# Patient Record
Sex: Female | Born: 1935 | Race: White | Hispanic: No | Marital: Married | Smoking: Former smoker
Health system: Southern US, Community
[De-identification: ages and names within clinical notes are randomized; demographics above are authoritative.]

## PROBLEM LIST (undated history)

## (undated) DIAGNOSIS — F419 Anxiety disorder, unspecified: Secondary | ICD-10-CM

## (undated) DIAGNOSIS — K5900 Constipation, unspecified: Secondary | ICD-10-CM

## (undated) DIAGNOSIS — F329 Major depressive disorder, single episode, unspecified: Secondary | ICD-10-CM

## (undated) DIAGNOSIS — M549 Dorsalgia, unspecified: Secondary | ICD-10-CM

## (undated) DIAGNOSIS — D649 Anemia, unspecified: Secondary | ICD-10-CM

## (undated) DIAGNOSIS — F32A Depression, unspecified: Secondary | ICD-10-CM

## (undated) DIAGNOSIS — J45909 Unspecified asthma, uncomplicated: Secondary | ICD-10-CM

## (undated) DIAGNOSIS — G8929 Other chronic pain: Secondary | ICD-10-CM

## (undated) DIAGNOSIS — G4700 Insomnia, unspecified: Secondary | ICD-10-CM

## (undated) DIAGNOSIS — R112 Nausea with vomiting, unspecified: Secondary | ICD-10-CM

## (undated) DIAGNOSIS — M542 Cervicalgia: Secondary | ICD-10-CM

## (undated) DIAGNOSIS — K219 Gastro-esophageal reflux disease without esophagitis: Secondary | ICD-10-CM

## (undated) DIAGNOSIS — M1990 Unspecified osteoarthritis, unspecified site: Secondary | ICD-10-CM

## (undated) DIAGNOSIS — Z9889 Other specified postprocedural states: Secondary | ICD-10-CM

## (undated) HISTORY — PX: CERVICAL SPINE SURGERY: SHX589

## (undated) HISTORY — PX: COLONOSCOPY W/ POLYPECTOMY: SHX1380

## (undated) HISTORY — PX: ROTATOR CUFF REPAIR: SHX139

## (undated) HISTORY — PX: CERVICAL FUSION: SHX112

## (undated) HISTORY — PX: SHOULDER ARTHROSCOPY: SHX128

## (undated) HISTORY — PX: CARPAL TUNNEL RELEASE: SHX101

## (undated) HISTORY — PX: BACK SURGERY: SHX140

---

## 1999-12-20 ENCOUNTER — Encounter: Payer: Self-pay | Admitting: Urology

## 1999-12-20 ENCOUNTER — Encounter: Admission: RE | Admit: 1999-12-20 | Discharge: 1999-12-20 | Payer: Self-pay | Admitting: Urology

## 1999-12-26 ENCOUNTER — Encounter: Admission: RE | Admit: 1999-12-26 | Discharge: 1999-12-26 | Payer: Self-pay | Admitting: Gastroenterology

## 1999-12-26 ENCOUNTER — Encounter: Payer: Self-pay | Admitting: Gastroenterology

## 2005-03-06 ENCOUNTER — Inpatient Hospital Stay (HOSPITAL_COMMUNITY): Admission: RE | Admit: 2005-03-06 | Discharge: 2005-03-07 | Payer: Self-pay | Admitting: Neurosurgery

## 2005-06-07 ENCOUNTER — Inpatient Hospital Stay (HOSPITAL_COMMUNITY): Admission: RE | Admit: 2005-06-07 | Discharge: 2005-06-13 | Payer: Self-pay | Admitting: Neurosurgery

## 2012-03-24 ENCOUNTER — Other Ambulatory Visit: Payer: Self-pay | Admitting: Orthopedic Surgery

## 2012-03-27 ENCOUNTER — Encounter (HOSPITAL_BASED_OUTPATIENT_CLINIC_OR_DEPARTMENT_OTHER): Payer: Self-pay | Admitting: *Deleted

## 2012-03-27 NOTE — Progress Notes (Signed)
Wife from [place]-very anxious-chronic pain-talked with husband No labs needed

## 2012-04-01 ENCOUNTER — Encounter (HOSPITAL_BASED_OUTPATIENT_CLINIC_OR_DEPARTMENT_OTHER): Payer: Self-pay | Admitting: Certified Registered Nurse Anesthetist

## 2012-04-01 ENCOUNTER — Ambulatory Visit (HOSPITAL_BASED_OUTPATIENT_CLINIC_OR_DEPARTMENT_OTHER): Payer: Medicare Other | Admitting: Certified Registered Nurse Anesthetist

## 2012-04-01 ENCOUNTER — Encounter (HOSPITAL_BASED_OUTPATIENT_CLINIC_OR_DEPARTMENT_OTHER): Payer: Self-pay | Admitting: Orthopedic Surgery

## 2012-04-01 ENCOUNTER — Ambulatory Visit (HOSPITAL_BASED_OUTPATIENT_CLINIC_OR_DEPARTMENT_OTHER)
Admission: RE | Admit: 2012-04-01 | Discharge: 2012-04-01 | Disposition: A | Discharge status: Home / Self Care | Payer: Medicare Other | Source: Ambulatory Visit | Attending: Orthopedic Surgery | Admitting: Orthopedic Surgery

## 2012-04-01 ENCOUNTER — Encounter (HOSPITAL_BASED_OUTPATIENT_CLINIC_OR_DEPARTMENT_OTHER)
Admission: RE | Disposition: A | Discharge status: Home / Self Care | Payer: Self-pay | Source: Ambulatory Visit | Attending: Orthopedic Surgery

## 2012-04-01 ENCOUNTER — Encounter (HOSPITAL_BASED_OUTPATIENT_CLINIC_OR_DEPARTMENT_OTHER): Payer: Self-pay | Admitting: *Deleted

## 2012-04-01 DIAGNOSIS — M65839 Other synovitis and tenosynovitis, unspecified forearm: Secondary | ICD-10-CM | POA: Insufficient documentation

## 2012-04-01 DIAGNOSIS — M6530 Trigger finger, unspecified finger: Secondary | ICD-10-CM | POA: Insufficient documentation

## 2012-04-01 DIAGNOSIS — K219 Gastro-esophageal reflux disease without esophagitis: Secondary | ICD-10-CM | POA: Insufficient documentation

## 2012-04-01 DIAGNOSIS — M65849 Other synovitis and tenosynovitis, unspecified hand: Secondary | ICD-10-CM | POA: Insufficient documentation

## 2012-04-01 HISTORY — DX: Constipation, unspecified: K59.00

## 2012-04-01 HISTORY — DX: Anxiety disorder, unspecified: F41.9

## 2012-04-01 HISTORY — DX: Cervicalgia: M54.2

## 2012-04-01 HISTORY — PX: TRIGGER FINGER RELEASE: SHX641

## 2012-04-01 HISTORY — DX: Insomnia, unspecified: G47.00

## 2012-04-01 HISTORY — DX: Other chronic pain: G89.29

## 2012-04-01 HISTORY — DX: Gastro-esophageal reflux disease without esophagitis: K21.9

## 2012-04-01 HISTORY — DX: Unspecified osteoarthritis, unspecified site: M19.90

## 2012-04-01 HISTORY — DX: Major depressive disorder, single episode, unspecified: F32.9

## 2012-04-01 HISTORY — DX: Dorsalgia, unspecified: M54.9

## 2012-04-01 HISTORY — DX: Depression, unspecified: F32.A

## 2012-04-01 LAB — POCT HEMOGLOBIN-HEMACUE: Hemoglobin: 10.8 g/dL — ABNORMAL LOW (ref 12.0–15.0)

## 2012-04-01 SURGERY — MINOR RELEASE TRIGGER FINGER/A-1 PULLEY
Anesthesia: Monitor Anesthesia Care | Site: Hand | Laterality: Right | Wound class: Clean

## 2012-04-01 MED ORDER — LACTATED RINGERS IV SOLN
INTRAVENOUS | Status: DC
  Administered 2012-04-01: 09:00:00 via INTRAVENOUS

## 2012-04-01 MED ORDER — FENTANYL CITRATE 0.05 MG/ML IJ SOLN
INTRAMUSCULAR | Status: DC | PRN
  Administered 2012-04-01 (×2): 50 ug via INTRAVENOUS

## 2012-04-01 MED ORDER — PROPOFOL 10 MG/ML IV EMUL
INTRAVENOUS | Status: DC | PRN
  Administered 2012-04-01: 75 ug/kg/min via INTRAVENOUS

## 2012-04-01 MED ORDER — CEFAZOLIN SODIUM 1-5 GM-% IV SOLN
INTRAVENOUS | Status: DC | PRN
  Administered 2012-04-01: 1 g via INTRAVENOUS

## 2012-04-01 MED ORDER — CHLORHEXIDINE GLUCONATE 4 % EX LIQD: 60.0000 mL | Freq: Once | CUTANEOUS | Status: DC

## 2012-04-01 MED ORDER — OXYCODONE HCL 5 MG PO TABS: 5.0000 mg | ORAL_TABLET | Freq: Once | ORAL | Status: DC | PRN

## 2012-04-01 MED ORDER — ONDANSETRON HCL 4 MG/2ML IJ SOLN
INTRAMUSCULAR | Status: DC | PRN
  Administered 2012-04-01: 4 mg via INTRAVENOUS

## 2012-04-01 MED ORDER — MIDAZOLAM HCL 5 MG/5ML IJ SOLN
INTRAMUSCULAR | Status: DC | PRN
  Administered 2012-04-01 (×2): 1 mg via INTRAVENOUS

## 2012-04-01 MED ORDER — METOCLOPRAMIDE HCL 5 MG/ML IJ SOLN: 10.0000 mg | Freq: Once | INTRAMUSCULAR | Status: DC | PRN

## 2012-04-01 MED ORDER — LIDOCAINE HCL (PF) 0.5 % IJ SOLN
INTRAMUSCULAR | Status: DC | PRN
  Administered 2012-04-01: 30 mL via INTRATHECAL

## 2012-04-01 MED ORDER — FENTANYL CITRATE 0.05 MG/ML IJ SOLN: 25.0000 ug | INTRAMUSCULAR | Status: DC | PRN

## 2012-04-01 MED ORDER — HYDROCODONE-ACETAMINOPHEN 5-500 MG PO TABS: 1.0000 | ORAL_TABLET | ORAL | Status: AC | PRN

## 2012-04-01 MED ORDER — BUPIVACAINE HCL (PF) 0.25 % IJ SOLN
INTRAMUSCULAR | Status: DC | PRN
  Administered 2012-04-01: 8 mL

## 2012-04-01 MED ORDER — DEXAMETHASONE SODIUM PHOSPHATE 10 MG/ML IJ SOLN
INTRAMUSCULAR | Status: DC | PRN
  Administered 2012-04-01: 10 mg via INTRAVENOUS

## 2012-04-01 MED ORDER — CEFAZOLIN SODIUM 1-5 GM-% IV SOLN: 1.0000 g | INTRAVENOUS | Status: DC

## 2012-04-01 SURGICAL SUPPLY — 32 items
BANDAGE COBAN STERILE 2 (GAUZE/BANDAGES/DRESSINGS) ×3 IMPLANT
BLADE SURG 15 STRL LF DISP TIS (BLADE) ×2 IMPLANT
BLADE SURG 15 STRL SS (BLADE) ×3
BNDG CMPR 9X4 STRL LF SNTH (GAUZE/BANDAGES/DRESSINGS)
BNDG ESMARK 4X9 LF (GAUZE/BANDAGES/DRESSINGS) IMPLANT
CHLORAPREP W/TINT 26ML (MISCELLANEOUS) ×3 IMPLANT
CLOTH BEACON ORANGE TIMEOUT ST (SAFETY) ×3 IMPLANT
CORDS BIPOLAR (ELECTRODE) ×2 IMPLANT
COVER MAYO STAND STRL (DRAPES) ×3 IMPLANT
COVER TABLE BACK 60X90 (DRAPES) ×3 IMPLANT
CUFF TOURNIQUET SINGLE 18IN (TOURNIQUET CUFF) ×2 IMPLANT
DECANTER SPIKE VIAL GLASS SM (MISCELLANEOUS) IMPLANT
DRAPE EXTREMITY T 121X128X90 (DRAPE) ×3 IMPLANT
DRAPE SURG 17X23 STRL (DRAPES) ×3 IMPLANT
GAUZE XEROFORM 1X8 LF (GAUZE/BANDAGES/DRESSINGS) ×3 IMPLANT
GLOVE BIO SURGEON STRL SZ 6.5 (GLOVE) ×3 IMPLANT
GLOVE SURG ORTHO 8.0 STRL STRW (GLOVE) ×3 IMPLANT
GOWN BRE IMP PREV XXLGXLNG (GOWN DISPOSABLE) ×3 IMPLANT
GOWN PREVENTION PLUS XLARGE (GOWN DISPOSABLE) ×3 IMPLANT
NEEDLE 27GAX1X1/2 (NEEDLE) IMPLANT
NS IRRIG 1000ML POUR BTL (IV SOLUTION) ×3 IMPLANT
PACK BASIN DAY SURGERY FS (CUSTOM PROCEDURE TRAY) ×3 IMPLANT
PADDING CAST ABS 4INX4YD NS (CAST SUPPLIES) ×1
PADDING CAST ABS COTTON 4X4 ST (CAST SUPPLIES) ×2 IMPLANT
SPONGE GAUZE 4X4 12PLY (GAUZE/BANDAGES/DRESSINGS) ×3 IMPLANT
STOCKINETTE 4X48 STRL (DRAPES) ×3 IMPLANT
SUT VICRYL RAPIDE 4/0 PS 2 (SUTURE) ×3 IMPLANT
SYR BULB 3OZ (MISCELLANEOUS) ×3 IMPLANT
SYR CONTROL 10ML LL (SYRINGE) IMPLANT
TOWEL OR 17X24 6PK STRL BLUE (TOWEL DISPOSABLE) ×6 IMPLANT
UNDERPAD 30X30 INCONTINENT (UNDERPADS AND DIAPERS) ×3 IMPLANT
WATER STERILE IRR 1000ML POUR (IV SOLUTION) ×3 IMPLANT

## 2012-04-01 NOTE — Anesthesia Preprocedure Evaluation (Signed)
Anesthesia Evaluation  Patient identified by MRN, date of birth, ID band Patient awake    Reviewed: Allergy & Precautions, H&P , NPO status , Patient's Chart, lab work & pertinent test results, reviewed documented beta blocker date and time   Airway Mallampati: II TM Distance: >3 FB Neck ROM: full    Dental   Pulmonary neg pulmonary ROS,          Cardiovascular negative cardio ROS      Neuro/Psych negative neurological ROS  negative psych ROS   GI/Hepatic Neg liver ROS, GERD-  Medicated and Controlled,  Endo/Other  negative endocrine ROS  Renal/GU negative Renal ROS  negative genitourinary   Musculoskeletal   Abdominal   Peds  Hematology negative hematology ROS (+)   Anesthesia Other Findings See surgeon's H&P   Reproductive/Obstetrics negative OB ROS                           Anesthesia Physical Anesthesia Plan  ASA: II  Anesthesia Plan: MAC and Bier Block   Post-op Pain Management:    Induction:   Airway Management Planned: Simple Face Mask  Additional Equipment:   Intra-op Plan:   Post-operative Plan:   Informed Consent: I have reviewed the patients History and Physical, chart, labs and discussed the procedure including the risks, benefits and alternatives for the proposed anesthesia with the patient or authorized representative who has indicated his/her understanding and acceptance.   Dental Advisory Given  Plan Discussed with: CRNA and Surgeon  Anesthesia Plan Comments:         Anesthesia Quick Evaluation

## 2012-04-01 NOTE — Op Note (Signed)
NAME:  Wendy Le,                        ACCOUNT NO.:  [id number]  MEDICAL RECORD NO.:  [id number]  LOCATION:                                 FACILITY:  PHYSICIAN:  Cindee Salt, M.D.            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  POSTOPERATIVE DIAGNOSES:  Stenosing tenosynovitis of right middle and right ring fingers.  Triggering middle finger distally.  OPERATION:  Release A1 pulley of right middle and right ring finger with excision of one limb superficialis ulnar side of middle finger.  SURGEON:  Cindee Salt, M.D.  ANESTHESIA:  Forearm based IV regional with local infiltration.  ANESTHESIOLOGIST:  Janetta Hora. Gelene Mink, M.D.  HISTORY:  The patient is a 76 year old female with a history of triggering of her right middle and right ring finger.  This is not responded to conservative treatment.  She has elected to undergo surgical release of the triggering. Pre, peri, and postoperative course had been discussed along with risks and complications.  She is aware that there is no guarantee with the surgery; possibility of infection; recurrence of injury to arteries, nerves, tendons; incomplete relief of symptoms and dystrophy.  In the preoperative area, the patient is seen, the extremity marked by both the patient and surgeon, and antibiotic given.  PROCEDURE:  The patient was brought to the operating room where a forearm-based IV regional anesthetic was carried out without difficulty. She was prepped using ChloraPrep, supine position with left arm free.  A 3-minute dry time was allowed.  Time-out taken, confirming the patient and procedure.  An oblique incision was made over the A1 pulley of the right middle finger carried down through subcutaneous tissue.  Bleeders were electrocauterized with bipolar.  The A1 pulley was  released on its radial aspect.  Significant fraying of the superficialis tendon both slips was immediately noted.  A small incision was made centrally in A2. A cyst was present on the flexor sheath, this was excised on the A1 pulley.  The finger placed through a full range motion, no further triggering was noted with flexion and extension.  The wound was extended distally.  A large cyst was present over the central aspect of the A2 pulley.  The tendons were brought proximally.  A significant enlargement of the ulnar slip of the superficialis was noted.  It was decided to proceed with excision of this with to prevent continued triggering.  The slip was brought proximally.  This was transected distally.  An oblique incision was made proximally maintaining the radial slip intact.  Care was taken to protect the neurovascular bundles both radially and ulnarly.  The finger placed through a full range motion, no further triggering was noted.  The wound was copiously irrigated with saline and the skin closed with interrupted 4-0 Vicryl Rapide sutures.  A separate incision was then made obliquely over the ring finger A1 pulley, again carried down through subcutaneous tissue, thickening of the pulley was immediately  encountered, this was released on its radial aspect.  A significant fluid collection was noted proximally.  The small incision was made centrally in A2 after releasing the radial side of the A1.  The finger placed through full range motion, no further triggering was noted.  The wound was copiously irrigated with saline.  The skin was closed with interrupted 4-0 Vicryl Rapide sutures.  Local infiltration with 0.25% Marcaine without epinephrine was given, approximately 7-8 mL was used.  A sterile compressive dressing to the palm and fingers was applied.  On deflation of the tourniquet, all fingers were immediately pinked.  She was taken to the recovery room.           ______________________________ Cindee Salt, M.D.     GK/MEDQ  D:  04/01/2012  T:  04/01/2012  Job:  [id number]

## 2012-04-01 NOTE — Anesthesia Postprocedure Evaluation (Signed)
Anesthesia Post Note  Patient: Wendy Le  Procedure(s) Performed: Procedure(s) (LRB): RELEASE TRIGGER FINGER/A-1 PULLEY (Right)  Anesthesia type: MAC  Patient location: PACU  Post pain: Pain level controlled  Post assessment: Patient's Cardiovascular Status Stable  Last Vitals:  Filed Vitals:   04/01/12 1126  BP: 130/49  Pulse: 77  Temp: 36.7 C  Resp: 16    Post vital signs: Reviewed and stable  Level of consciousness: alert  Complications: No apparent anesthesia complications

## 2012-04-01 NOTE — Op Note (Signed)
Dictated number: [id number]

## 2012-04-01 NOTE — Anesthesia Procedure Notes (Signed)
Procedure Name: MAC Date/Time: 04/01/2012 10:01 AM Performed by: Burna Cash Pre-anesthesia Checklist: Patient identified, Emergency Drugs available, Suction available, Patient being monitored and Timeout performed Oxygen Delivery Method: Simple face mask

## 2012-04-01 NOTE — Brief Op Note (Signed)
04/01/2012  10:42 AM  PATIENT:  Wendy Le  76 y.o. female  PRE-OPERATIVE DIAGNOSIS:  stenosing tenosynovitis right middle and ring fingers  POST-OPERATIVE DIAGNOSIS:  same as preop  PROCEDURE:  Procedure(s) (LRB): RELEASE TRIGGER FINGER/A-1 PULLEY (Right)  SURGEON:  Surgeon(s) and Role:    * Nicki Reaper, MD - Primary  PHYSICIAN ASSISTANT:   ASSISTANTS: none   ANESTHESIA:   local and regional  EBL:  Total I/O In: 700 [I.V.:700] Out: -   BLOOD ADMINISTERED:none  DRAINS: none   LOCAL MEDICATIONS USED:  MARCAINE     SPECIMEN:  No Specimen  DISPOSITION OF SPECIMEN:  N/A  COUNTS:  YES  TOURNIQUET:   Total Tourniquet Time Documented: Forearm (Right) - 27 minutes  DICTATION: .Other Dictation: Dictation Number [id number]  PLAN OF CARE: Discharge to home after PACU  PATIENT DISPOSITION:  PACU - hemodynamically stable.

## 2012-04-01 NOTE — Transfer of Care (Signed)
Immediate Anesthesia Transfer of Care Note  Patient: Wendy Le  Procedure(s) Performed: Procedure(s) (LRB): RELEASE TRIGGER FINGER/A-1 PULLEY (Right)  Patient Location: PACU  Anesthesia Type: Bier block  Level of Consciousness: awake, alert  and oriented  Airway & Oxygen Therapy: Patient Spontanous Breathing  Post-op Assessment: Report given to PACU RN and Post -op Vital signs reviewed and stable  Post vital signs: Reviewed and stable  Complications: No apparent anesthesia complications

## 2012-04-01 NOTE — Discharge Instructions (Addendum)

## 2012-04-01 NOTE — H&P (Signed)
Wendy Le is a 76 year-old right-hand dominant female referred by Dr. Hilda Lias for consultation with complaint of bilateral hand pain, contractures every day.  This has been going on for several years.  She has very long history having had bilateral carpal tunnel release done 7-8 years ago by Dr. Eden Emms, subsequently had cervical fusion x 2 by Dr. Jeral Fruit at C5-6.  She has atrophy of her thenar musculature.  She is complaining of tremor, discomfort, difficulty using her hands.  She has been taking meloxicam . She has no history of diabetes, thyroid problems, arthritis or gout.  She has had nerve conductions repeated revealing bilateral changes in the median nerves with an ulnar neuropathy at her elbow on her left side.    ALLERGIES:   Blue dye. MEDICATIONS:    Meloxicam, citalopram, pantoprazole, hydrodocone/apap, tramadol, zolpidem, cyclobenzaprine, alprazolam, Amitiza. SURGICAL HISTORY:  Surgeries noted in history.     FAMILY MEDICAL HISTORY:   Negative. SOCIAL HISTORY:    She does not smoke or drink. She is married, retired.  REVIEW OF SYSTEMS:   Positive for glasses, headaches, otherwise negative 14 points.  Wendy Le is an 76 y.o. female.   Chief Complaint: sts rmf / rrf HPI: see above  Past Medical History  Diagnosis Date  . Anxiety   . Depression   . Arthritis   . Chronic back pain   . Chronic neck pain   . GERD (gastroesophageal reflux disease)   . Constipation   . Insomnia     Past Surgical History  Procedure Date  . Shoulder arthroscopy     right  . Cervical fusion   . Back surgery     lumb   . Carpal tunnel release     bilat    No family history on file. Social History:  reports that she quit smoking about 23 years ago. She does not have any smokeless tobacco history on file. She reports that she drinks alcohol. She reports that she does not use illicit drugs.  Allergies:  Allergies  Allergen Reactions  . Contrast Media (Iodinated Diagnostic Agents)  Anaphylaxis    Blue dye    Medications Prior to Admission  Medication Sig Dispense Refill  . ALPRAZolam (XANAX) 0.5 MG tablet Take 0.5 mg by mouth at bedtime as needed.      . citalopram (CELEXA) 40 MG tablet Take 40 mg by mouth daily.      . cyclobenzaprine (FLEXERIL) 10 MG tablet Take 10 mg by mouth 3 (three) times daily as needed.      Marland Kitchen HYDROcodone-acetaminophen (NORCO) 5-325 MG per tablet Take 1 tablet by mouth every 6 (six) hours as needed.      . meloxicam (MOBIC) 15 MG tablet Take 15 mg by mouth daily.      Marland Kitchen omeprazole (PRILOSEC) 40 MG capsule Take 40 mg by mouth daily.      . SUMAtriptan (IMITREX) 100 MG tablet Take 100 mg by mouth every 2 (two) hours as needed.      . traMADol (ULTRAM) 50 MG tablet Take 50 mg by mouth every 6 (six) hours as needed.      . zolpidem (AMBIEN) 10 MG tablet Take 10 mg by mouth at bedtime as needed.        No results found for this or any previous visit (from the past 48 hour(s)).  No results found.   Pertinent items are noted in HPI.  Blood pressure 135/75, pulse 80, temperature 97.9 F (36.6 C),  temperature source Oral, resp. rate 18, height 5\' 3"  (1.6 m), weight 64.411 kg (142 lb), SpO2 95.00%.  General appearance: alert, cooperative and appears stated age Head: Normocephalic, without obvious abnormality Neck: no adenopathy Resp: clear to auscultation bilaterally Cardio: regular rate and rhythm, S1, S2 normal, no murmur, click, rub or gallop GI: soft, non-tender; bowel sounds normal; no masses,  no organomegaly Extremities: extremities normal, atraumatic, no cyanosis or edema Pulses: 2+ and symmetric Skin: Skin color, texture, turgor normal. No rashes or lesions Neurologic: Grossly normal Incision/Wound: na  Assessment/Plan   Her right side, middle and ring fingers continue to trigger for her.   Sensation and circulation are intact.  Nodules are easily palpable with triggering of both middle and ring, right hand.  We have discussed  with her the possibility of surgical decompression of the A-1 pulleys middle and ring fingers of her right hand.    The pre, peri and postoperative course were discussed along with the risks and complications.  The patient is aware there is no guarantee with the surgery, possibility of infection, recurrence, injury to arteries, nerves, tendons, incomplete relief of symptoms and dystrophy.  She has had two injections with no relief.  She is scheduled for release A-1 pulley middle and ring fingers right hand as an outpatient.  Wendy Le R 04/01/2012, 9:45 AM

## 2012-04-02 ENCOUNTER — Encounter (HOSPITAL_BASED_OUTPATIENT_CLINIC_OR_DEPARTMENT_OTHER): Payer: Self-pay | Admitting: Orthopedic Surgery

## 2014-06-30 ENCOUNTER — Other Ambulatory Visit: Payer: Self-pay | Admitting: Neurosurgery

## 2014-06-30 DIAGNOSIS — M5417 Radiculopathy, lumbosacral region: Secondary | ICD-10-CM

## 2014-07-13 ENCOUNTER — Ambulatory Visit
Admission: RE | Admit: 2014-07-13 | Discharge: 2014-07-13 | Disposition: A | Discharge status: Home / Self Care | Payer: Commercial Managed Care - HMO | Source: Ambulatory Visit | Attending: Neurosurgery | Admitting: Neurosurgery

## 2014-07-13 VITALS — BP 123/64 | HR 64

## 2014-07-13 DIAGNOSIS — M5417 Radiculopathy, lumbosacral region: Secondary | ICD-10-CM

## 2014-07-13 MED ORDER — MEPERIDINE HCL 100 MG/ML IJ SOLN
50.0000 mg | Freq: Once | INTRAMUSCULAR | Status: AC
  Administered 2014-07-13: 50 mg via INTRAMUSCULAR

## 2014-07-13 MED ORDER — IOHEXOL 180 MG/ML  SOLN
20.0000 mL | Freq: Once | INTRAMUSCULAR | Status: AC | PRN
  Administered 2014-07-13: 18 mL via INTRATHECAL

## 2014-07-13 MED ORDER — DIAZEPAM 5 MG PO TABS
5.0000 mg | ORAL_TABLET | Freq: Once | ORAL | Status: AC
  Administered 2014-07-13: 5 mg via ORAL

## 2014-07-13 MED ORDER — ONDANSETRON HCL 4 MG/2ML IJ SOLN
4.0000 mg | Freq: Once | INTRAMUSCULAR | Status: AC
  Administered 2014-07-13: 4 mg via INTRAMUSCULAR

## 2014-07-13 NOTE — Discharge Instructions (Signed)
Myelogram Discharge Instructions  1. Go home and rest quietly for the next 24 hours.  It is important to lie flat for the next 24 hours.  Get up only to go to the restroom.  You may lie in the bed or on a couch on your back, your stomach, your left side or your right side.  You may have one pillow under your head.  You may have pillows between your knees while you are on your side or under your knees while you are on your back.  2. DO NOT drive today.  Recline the seat as far back as it will go, while still wearing your seat belt, on the way home.  3. You may get up to go to the bathroom as needed.  You may sit up for 10 minutes to eat.  You may resume your normal diet and medications unless otherwise indicated.  Drink lots of extra fluids today and tomorrow.  4. The incidence of headache, nausea, or vomiting is about 5% (one in 20 patients).  If you develop a headache, lie flat and drink plenty of fluids until the headache goes away.  Caffeinated beverages may be helpful.  If you develop severe nausea and vomiting or a headache that does not go away with flat bed rest, call [phone].  5. You may resume normal activities after your 24 hours of bed rest is over; however, do not exert yourself strongly or do any heavy lifting tomorrow. If when you get up you have a headache when standing, go back to bed and force fluids for another 24 hours.  6. Call your physician for a follow-up appointment.  The results of your myelogram will be sent directly to your physician by the following day.  7. If you have any questions or if complications develop after you arrive home, please call [phone].  Discharge instructions have been explained to the patient.  The patient, or the person responsible for the patient, fully understands these instructions.      May resume Celexa, Imitrex, Sumatriptan and Tramadol on Sept. 24, 2015, after 1:00 pm.

## 2014-07-13 NOTE — Progress Notes (Signed)
Patient states she has been off Celexa, Sumatriptan and Tramadol for at least the past two days.  Patient states she took her 13-hour prep as prescribed.

## 2014-07-28 ENCOUNTER — Other Ambulatory Visit: Payer: Self-pay | Admitting: Neurosurgery

## 2014-08-15 ENCOUNTER — Encounter (HOSPITAL_COMMUNITY): Payer: Self-pay | Admitting: Pharmacy Technician

## 2014-08-18 ENCOUNTER — Encounter (HOSPITAL_COMMUNITY): Payer: Self-pay

## 2014-08-18 ENCOUNTER — Encounter (HOSPITAL_COMMUNITY)
Admission: RE | Admit: 2014-08-18 | Discharge: 2014-08-18 | Disposition: A | Discharge status: Home / Self Care | Payer: Medicare PPO | Source: Ambulatory Visit | Attending: Neurosurgery | Admitting: Neurosurgery

## 2014-08-18 DIAGNOSIS — Z01818 Encounter for other preprocedural examination: Secondary | ICD-10-CM | POA: Diagnosis present

## 2014-08-18 HISTORY — DX: Nausea with vomiting, unspecified: R11.2

## 2014-08-18 HISTORY — DX: Other specified postprocedural states: Z98.890

## 2014-08-18 HISTORY — DX: Anemia, unspecified: D64.9

## 2014-08-18 HISTORY — DX: Unspecified asthma, uncomplicated: J45.909

## 2014-08-18 LAB — BASIC METABOLIC PANEL
Anion gap: 12 (ref 5–15)
BUN: 17 mg/dL (ref 6–23)
CHLORIDE: 103 meq/L (ref 96–112)
CO2: 26 mEq/L (ref 19–32)
CREATININE: 0.81 mg/dL (ref 0.50–1.10)
Calcium: 9.5 mg/dL (ref 8.4–10.5)
GFR calc Af Amer: 80 mL/min — ABNORMAL LOW (ref >90)
GFR calc non Af Amer: 69 mL/min — ABNORMAL LOW (ref >90)
Glucose, Bld: 85 mg/dL (ref 70–99)
Potassium: 3.9 mEq/L (ref 3.7–5.3)
Sodium: 141 mEq/L (ref 137–147)

## 2014-08-18 LAB — CBC
HEMATOCRIT: 35.7 % — AB (ref 36.0–46.0)
Hemoglobin: 12.2 g/dL (ref 12.0–15.0)
MCH: 31.3 pg (ref 26.0–34.0)
MCHC: 34.2 g/dL (ref 30.0–36.0)
MCV: 91.5 fL (ref 78.0–100.0)
Platelets: 346 10*3/uL (ref 150–400)
RBC: 3.9 MIL/uL (ref 3.87–5.11)
RDW: 13.6 % (ref 11.5–15.5)
WBC: 8.5 10*3/uL (ref 4.0–10.5)

## 2014-08-18 LAB — SURGICAL PCR SCREEN
MRSA, PCR: NEGATIVE
Staphylococcus aureus: NEGATIVE

## 2014-08-25 MED ORDER — CEFAZOLIN SODIUM-DEXTROSE 2-3 GM-% IV SOLR: 2.0000 g | INTRAVENOUS | Status: DC

## 2014-08-25 NOTE — H&P (Addendum)
Wendy Le is an 78 y.o. female.   Chief Complaint: pain in both legs HPI: patient who had lumbar fusion at l5s1 about two years ago but now she is developing lumbar pain with radiation to both lower extremities, no better with conservative treatment.   Past Medical History  Diagnosis Date  . Anxiety   . Depression   . Arthritis   . Chronic back pain   . Chronic neck pain   . GERD (gastroesophageal reflux disease)   . Constipation   . Insomnia   . PONV (postoperative nausea and vomiting)   . Asthma     does not use inhaler  . Anemia     Past Surgical History  Procedure Laterality Date  . Shoulder arthroscopy      right  . Cervical fusion    . Back surgery      lumb   . Carpal tunnel release      bilat  . Trigger finger release  04/01/2012    Procedure: MINOR RELEASE TRIGGER FINGER/A-1 PULLEY;  Surgeon: Nicki ReaperGary R Kuzma, MD;  [facility]  [facility];  Service: Orthopedics;  Laterality: Right;  right middle and ring fingers  . Cervical spine surgery    . Rotator cuff repair Bilateral   . Colonoscopy w/ polypectomy      No family history on file. Social History:  reports that she quit smoking about 25 years ago. She does not have any smokeless tobacco history on file. She reports that she drinks alcohol. She reports that she does not use illicit drugs.  Allergies:  Allergies  Allergen Reactions  . Other Hives and Other (See Comments)    methyline blue was injected into bladder/kidneys via catheter inserted into bladder "many, many years ago."  Also caused blisters.    No prescriptions prior to admission    No results found for this or any previous visit (from the past 48 hour(s)). No results found.  Review of Systems  Constitutional: Negative.   HENT: Negative.   Eyes: Negative.   Respiratory: Negative.   Cardiovascular: Negative.   Gastrointestinal: Negative.   Musculoskeletal: Positive for back pain.  Skin: Negative.   Neurological: Positive for  sensory change and focal weakness.  Endo/Heme/Allergies: Negative.   Psychiatric/Behavioral: Negative.     There were no vitals taken for this visit. Physical Exam hent, nl. Neck, nl. Cv, nl. Lungs, clear. Cv, nl. Abdomen, soft. Extremities, nl. NEURO  slr positive bilaterally. Some proximal weakness. Dtr, nl. Myelogram of the limbar spine shows severe stenosis at lumbar23, 34 with solid fusion at l5s1  Assessment/Plan Patient had conservative treatment including epidural injections with no relief of the pain. She and her husbang agree with decompression and are aware of risks and benefits  Nasiir Monts M 08/25/2014, 5:43 PM

## 2014-08-26 ENCOUNTER — Inpatient Hospital Stay (HOSPITAL_COMMUNITY): Payer: Medicare PPO | Admitting: Anesthesiology

## 2014-08-26 ENCOUNTER — Encounter (HOSPITAL_COMMUNITY)
Admission: RE | Disposition: A | Discharge status: Home / Self Care | Payer: Self-pay | Source: Ambulatory Visit | Attending: Neurosurgery

## 2014-08-26 ENCOUNTER — Encounter (HOSPITAL_COMMUNITY): Payer: Self-pay | Admitting: *Deleted

## 2014-08-26 ENCOUNTER — Inpatient Hospital Stay (HOSPITAL_COMMUNITY)
Admission: RE | Admit: 2014-08-26 | Discharge: 2014-08-29 | DRG: 517 | Disposition: A | Discharge status: Home / Self Care | Payer: Medicare PPO | Source: Ambulatory Visit | Attending: Neurosurgery | Admitting: Neurosurgery

## 2014-08-26 ENCOUNTER — Inpatient Hospital Stay (HOSPITAL_COMMUNITY): Payer: Medicare PPO

## 2014-08-26 DIAGNOSIS — Z87891 Personal history of nicotine dependence: Secondary | ICD-10-CM

## 2014-08-26 DIAGNOSIS — M48061 Spinal stenosis, lumbar region without neurogenic claudication: Secondary | ICD-10-CM

## 2014-08-26 DIAGNOSIS — M545 Low back pain: Secondary | ICD-10-CM | POA: Diagnosis present

## 2014-08-26 DIAGNOSIS — Z23 Encounter for immunization: Secondary | ICD-10-CM

## 2014-08-26 DIAGNOSIS — M4806 Spinal stenosis, lumbar region: Principal | ICD-10-CM | POA: Diagnosis present

## 2014-08-26 DIAGNOSIS — I739 Peripheral vascular disease, unspecified: Secondary | ICD-10-CM | POA: Diagnosis present

## 2014-08-26 DIAGNOSIS — M48062 Spinal stenosis, lumbar region with neurogenic claudication: Secondary | ICD-10-CM | POA: Diagnosis present

## 2014-08-26 HISTORY — PX: LUMBAR LAMINECTOMY/DECOMPRESSION MICRODISCECTOMY: SHX5026

## 2014-08-26 SURGERY — LUMBAR LAMINECTOMY/DECOMPRESSION MICRODISCECTOMY 2 LEVELS
Anesthesia: General

## 2014-08-26 MED ORDER — OXYCODONE-ACETAMINOPHEN 5-325 MG PO TABS
ORAL_TABLET | ORAL | Status: AC
  Filled 2014-08-26: qty 2

## 2014-08-26 MED ORDER — FENTANYL CITRATE 0.05 MG/ML IJ SOLN
INTRAMUSCULAR | Status: AC
  Filled 2014-08-26: qty 2

## 2014-08-26 MED ORDER — EPHEDRINE SULFATE 50 MG/ML IJ SOLN
INTRAMUSCULAR | Status: AC
  Filled 2014-08-26: qty 1

## 2014-08-26 MED ORDER — CEFAZOLIN SODIUM 1-5 GM-% IV SOLN
1.0000 g | Freq: Three times a day (TID) | INTRAVENOUS | Status: AC
  Administered 2014-08-26 – 2014-08-27 (×2): 1 g via INTRAVENOUS
  Filled 2014-08-26 (×2): qty 50

## 2014-08-26 MED ORDER — CITALOPRAM HYDROBROMIDE 40 MG PO TABS
40.0000 mg | ORAL_TABLET | Freq: Every day | ORAL | Status: DC
  Administered 2014-08-27 – 2014-08-29 (×3): 40 mg via ORAL
  Filled 2014-08-26 (×3): qty 1

## 2014-08-26 MED ORDER — FENTANYL CITRATE 0.05 MG/ML IJ SOLN
INTRAMUSCULAR | Status: AC
  Filled 2014-08-26: qty 5

## 2014-08-26 MED ORDER — VANCOMYCIN HCL 1000 MG IV SOLR
INTRAVENOUS | Status: DC | PRN
  Administered 2014-08-26: 1000 mg

## 2014-08-26 MED ORDER — ACETAMINOPHEN 650 MG RE SUPP: 650.0000 mg | RECTAL | Status: DC | PRN

## 2014-08-26 MED ORDER — BUPIVACAINE LIPOSOME 1.3 % IJ SUSP
INTRAMUSCULAR | Status: DC | PRN
Start: 2014-08-26 — End: 2014-08-26
  Administered 2014-08-26: 20 mL

## 2014-08-26 MED ORDER — MORPHINE SULFATE 2 MG/ML IJ SOLN
1.0000 mg | INTRAMUSCULAR | Status: DC | PRN
  Administered 2014-08-26 – 2014-08-29 (×7): 2 mg via INTRAVENOUS
  Filled 2014-08-26 (×7): qty 1

## 2014-08-26 MED ORDER — ONDANSETRON HCL 4 MG/2ML IJ SOLN
INTRAMUSCULAR | Status: DC | PRN
  Administered 2014-08-26: 4 mg via INTRAVENOUS

## 2014-08-26 MED ORDER — LACTATED RINGERS IV SOLN
INTRAVENOUS | Status: DC
  Administered 2014-08-26: 11:00:00 via INTRAVENOUS

## 2014-08-26 MED ORDER — PANTOPRAZOLE SODIUM 40 MG PO TBEC
40.0000 mg | DELAYED_RELEASE_TABLET | Freq: Every day | ORAL | Status: DC
  Administered 2014-08-27 – 2014-08-29 (×3): 40 mg via ORAL
  Filled 2014-08-26: qty 1

## 2014-08-26 MED ORDER — BUPIVACAINE LIPOSOME 1.3 % IJ SUSP
20.0000 mL | Freq: Once | INTRAMUSCULAR | Status: DC
  Filled 2014-08-26: qty 20

## 2014-08-26 MED ORDER — STERILE WATER FOR INJECTION IJ SOLN
INTRAMUSCULAR | Status: AC
  Filled 2014-08-26: qty 10

## 2014-08-26 MED ORDER — FENTANYL CITRATE 0.05 MG/ML IJ SOLN
INTRAMUSCULAR | Status: DC | PRN
  Administered 2014-08-26 (×5): 50 ug via INTRAVENOUS
  Administered 2014-08-26: 100 ug via INTRAVENOUS
  Administered 2014-08-26: 50 ug via INTRAVENOUS

## 2014-08-26 MED ORDER — OXYCODONE-ACETAMINOPHEN 5-325 MG PO TABS
1.0000 | ORAL_TABLET | ORAL | Status: DC | PRN
  Administered 2014-08-26 – 2014-08-29 (×11): 2 via ORAL
  Filled 2014-08-26 (×10): qty 2

## 2014-08-26 MED ORDER — HEMOSTATIC AGENTS (NO CHARGE) OPTIME
TOPICAL | Status: DC | PRN
  Administered 2014-08-26: 1 via TOPICAL

## 2014-08-26 MED ORDER — DIAZEPAM 5 MG PO TABS
5.0000 mg | ORAL_TABLET | Freq: Four times a day (QID) | ORAL | Status: DC | PRN
  Administered 2014-08-26 – 2014-08-29 (×4): 5 mg via ORAL
  Filled 2014-08-26 (×3): qty 1

## 2014-08-26 MED ORDER — MEPERIDINE HCL 25 MG/ML IJ SOLN: 6.2500 mg | INTRAMUSCULAR | Status: DC | PRN

## 2014-08-26 MED ORDER — PROPOFOL 10 MG/ML IV BOLUS
INTRAVENOUS | Status: AC
  Filled 2014-08-26: qty 20

## 2014-08-26 MED ORDER — SODIUM CHLORIDE 0.9 % IJ SOLN: 3.0000 mL | INTRAMUSCULAR | Status: DC | PRN

## 2014-08-26 MED ORDER — DIAZEPAM 5 MG PO TABS
ORAL_TABLET | ORAL | Status: AC
  Filled 2014-08-26: qty 1

## 2014-08-26 MED ORDER — GLYCOPYRROLATE 0.2 MG/ML IJ SOLN
INTRAMUSCULAR | Status: DC | PRN
  Administered 2014-08-26: .6 mg via INTRAVENOUS

## 2014-08-26 MED ORDER — LUBIPROSTONE 24 MCG PO CAPS
24.0000 ug | ORAL_CAPSULE | Freq: Every day | ORAL | Status: DC
  Administered 2014-08-27 – 2014-08-29 (×3): 24 ug via ORAL
  Filled 2014-08-26 (×3): qty 1

## 2014-08-26 MED ORDER — MENTHOL 3 MG MT LOZG: 1.0000 | LOZENGE | OROMUCOSAL | Status: DC | PRN

## 2014-08-26 MED ORDER — SODIUM CHLORIDE 0.9 % IV SOLN
INTRAVENOUS | Status: DC
  Administered 2014-08-26: 21:00:00 via INTRAVENOUS

## 2014-08-26 MED ORDER — SODIUM CHLORIDE 0.9 % IJ SOLN
3.0000 mL | Freq: Two times a day (BID) | INTRAMUSCULAR | Status: DC
  Administered 2014-08-26 – 2014-08-28 (×5): 3 mL via INTRAVENOUS

## 2014-08-26 MED ORDER — ACETAMINOPHEN 325 MG PO TABS
650.0000 mg | ORAL_TABLET | ORAL | Status: DC | PRN
  Administered 2014-08-27: 650 mg via ORAL
  Filled 2014-08-26: qty 2

## 2014-08-26 MED ORDER — LIDOCAINE HCL (CARDIAC) 20 MG/ML IV SOLN
INTRAVENOUS | Status: AC
  Filled 2014-08-26: qty 5

## 2014-08-26 MED ORDER — LIDOCAINE HCL (CARDIAC) 20 MG/ML IV SOLN
INTRAVENOUS | Status: DC | PRN
  Administered 2014-08-26: 100 mg via INTRAVENOUS

## 2014-08-26 MED ORDER — THROMBIN 5000 UNITS EX SOLR
CUTANEOUS | Status: DC | PRN
  Administered 2014-08-26 (×2): 5000 [IU] via TOPICAL

## 2014-08-26 MED ORDER — 0.9 % SODIUM CHLORIDE (POUR BTL) OPTIME
TOPICAL | Status: DC | PRN
  Administered 2014-08-26: 1000 mL

## 2014-08-26 MED ORDER — ARTIFICIAL TEARS OP OINT
TOPICAL_OINTMENT | OPHTHALMIC | Status: AC
  Filled 2014-08-26: qty 3.5

## 2014-08-26 MED ORDER — ONDANSETRON HCL 4 MG/2ML IJ SOLN: 4.0000 mg | INTRAMUSCULAR | Status: DC | PRN

## 2014-08-26 MED ORDER — SODIUM CHLORIDE 0.9 % IV SOLN: 250.0000 mL | INTRAVENOUS | Status: DC

## 2014-08-26 MED ORDER — ROCURONIUM BROMIDE 50 MG/5ML IV SOLN
INTRAVENOUS | Status: AC
  Filled 2014-08-26: qty 1

## 2014-08-26 MED ORDER — PHENOL 1.4 % MT LIQD
1.0000 | OROMUCOSAL | Status: DC | PRN
Start: 2014-08-26 — End: 2014-08-29

## 2014-08-26 MED ORDER — ONDANSETRON HCL 4 MG/2ML IJ SOLN
INTRAMUSCULAR | Status: AC
  Filled 2014-08-26: qty 2

## 2014-08-26 MED ORDER — PHENYLEPHRINE HCL 10 MG/ML IJ SOLN
INTRAMUSCULAR | Status: DC | PRN
  Administered 2014-08-26: 40 ug via INTRAVENOUS
  Administered 2014-08-26: 80 ug via INTRAVENOUS
  Administered 2014-08-26: 40 ug via INTRAVENOUS

## 2014-08-26 MED ORDER — VANCOMYCIN HCL 1000 MG IV SOLR
INTRAVENOUS | Status: AC
  Filled 2014-08-26: qty 1000

## 2014-08-26 MED ORDER — LACTATED RINGERS IV SOLN
INTRAVENOUS | Status: DC | PRN
  Administered 2014-08-26 (×2): via INTRAVENOUS

## 2014-08-26 MED ORDER — ZOLPIDEM TARTRATE 5 MG PO TABS
5.0000 mg | ORAL_TABLET | Freq: Every evening | ORAL | Status: DC | PRN
Start: 2014-08-26 — End: 2014-08-29
  Administered 2014-08-27 – 2014-08-28 (×2): 5 mg via ORAL
  Filled 2014-08-26 (×2): qty 1

## 2014-08-26 MED ORDER — PROMETHAZINE HCL 25 MG/ML IJ SOLN: 6.2500 mg | INTRAMUSCULAR | Status: DC | PRN

## 2014-08-26 MED ORDER — SCOPOLAMINE 1 MG/3DAYS TD PT72
MEDICATED_PATCH | TRANSDERMAL | Status: AC
  Administered 2014-08-26: 1.5 mg
  Filled 2014-08-26: qty 1

## 2014-08-26 MED ORDER — PROPOFOL 10 MG/ML IV BOLUS
INTRAVENOUS | Status: DC | PRN
  Administered 2014-08-26: 100 mg via INTRAVENOUS

## 2014-08-26 MED ORDER — CEFAZOLIN SODIUM-DEXTROSE 2-3 GM-% IV SOLR
INTRAVENOUS | Status: AC
  Administered 2014-08-26: 2 g via INTRAVENOUS
  Filled 2014-08-26: qty 50

## 2014-08-26 MED ORDER — ROCURONIUM BROMIDE 100 MG/10ML IV SOLN
INTRAVENOUS | Status: DC | PRN
  Administered 2014-08-26: 50 mg via INTRAVENOUS

## 2014-08-26 MED ORDER — FENTANYL CITRATE 0.05 MG/ML IJ SOLN
25.0000 ug | INTRAMUSCULAR | Status: DC | PRN
  Administered 2014-08-26 (×2): 25 ug via INTRAVENOUS
  Administered 2014-08-26 (×2): 50 ug via INTRAVENOUS

## 2014-08-26 MED ORDER — PANTOPRAZOLE SODIUM 40 MG PO TBEC
40.0000 mg | DELAYED_RELEASE_TABLET | Freq: Every day | ORAL | Status: DC
  Filled 2014-08-26 (×2): qty 1

## 2014-08-26 MED ORDER — NEOSTIGMINE METHYLSULFATE 10 MG/10ML IV SOLN
INTRAVENOUS | Status: DC | PRN
  Administered 2014-08-26: 4 mg via INTRAVENOUS

## 2014-08-26 SURGICAL SUPPLY — 63 items
APL SKNCLS STERI-STRIP NONHPOA (GAUZE/BANDAGES/DRESSINGS) ×1
BENZOIN TINCTURE PRP APPL 2/3 (GAUZE/BANDAGES/DRESSINGS) ×3 IMPLANT
BLADE CLIPPER SURG (BLADE) IMPLANT
BUR ACORN 6.0 (BURR) IMPLANT
BUR ACORN 6.0MM (BURR)
BUR MATCHSTICK NEURO 3.0 LAGG (BURR) ×3 IMPLANT
CANISTER SUCT 3000ML (MISCELLANEOUS) ×3 IMPLANT
CLOSURE WOUND 1/2 X4 (GAUZE/BANDAGES/DRESSINGS) ×1
CONT SPEC 4OZ CLIKSEAL STRL BL (MISCELLANEOUS) ×3 IMPLANT
DRAPE LAPAROTOMY 100X72X124 (DRAPES) ×3 IMPLANT
DRAPE MICROSCOPE LEICA (MISCELLANEOUS) ×3 IMPLANT
DRAPE POUCH INSTRU U-SHP 10X18 (DRAPES) ×3 IMPLANT
DRSG OPSITE POSTOP 4X6 (GAUZE/BANDAGES/DRESSINGS) ×2 IMPLANT
DRSG PAD ABDOMINAL 8X10 ST (GAUZE/BANDAGES/DRESSINGS) IMPLANT
DURAPREP 26ML APPLICATOR (WOUND CARE) ×3 IMPLANT
ELECT REM PT RETURN 9FT ADLT (ELECTROSURGICAL) ×3
ELECTRODE REM PT RTRN 9FT ADLT (ELECTROSURGICAL) ×1 IMPLANT
EVACUATOR 1/8 PVC DRAIN (DRAIN) ×2 IMPLANT
GAUZE SPONGE 4X4 12PLY STRL (GAUZE/BANDAGES/DRESSINGS) ×3 IMPLANT
GAUZE SPONGE 4X4 16PLY XRAY LF (GAUZE/BANDAGES/DRESSINGS) IMPLANT
GLOVE BIOGEL M 8.0 STRL (GLOVE) ×3 IMPLANT
GLOVE BIOGEL PI IND STRL 7.0 (GLOVE) IMPLANT
GLOVE BIOGEL PI INDICATOR 7.0 (GLOVE) ×4
GLOVE ECLIPSE 7.5 STRL STRAW (GLOVE) ×2 IMPLANT
GLOVE EXAM NITRILE LRG STRL (GLOVE) IMPLANT
GLOVE EXAM NITRILE MD LF STRL (GLOVE) IMPLANT
GLOVE EXAM NITRILE XL STR (GLOVE) IMPLANT
GLOVE EXAM NITRILE XS STR PU (GLOVE) IMPLANT
GLOVE SURG SS PI 7.0 STRL IVOR (GLOVE) ×4 IMPLANT
GOWN STRL REUS W/ TWL LRG LVL3 (GOWN DISPOSABLE) ×1 IMPLANT
GOWN STRL REUS W/ TWL XL LVL3 (GOWN DISPOSABLE) IMPLANT
GOWN STRL REUS W/TWL 2XL LVL3 (GOWN DISPOSABLE) ×2 IMPLANT
GOWN STRL REUS W/TWL LRG LVL3 (GOWN DISPOSABLE) ×6
GOWN STRL REUS W/TWL XL LVL3 (GOWN DISPOSABLE) ×3
KIT BASIN OR (CUSTOM PROCEDURE TRAY) ×3 IMPLANT
KIT ROOM TURNOVER OR (KITS) ×3 IMPLANT
NDL HYPO 18GX1.5 BLUNT FILL (NEEDLE) IMPLANT
NDL HYPO 21X1.5 SAFETY (NEEDLE) IMPLANT
NDL HYPO 25X1 1.5 SAFETY (NEEDLE) IMPLANT
NDL SPNL 20GX3.5 QUINCKE YW (NEEDLE) IMPLANT
NEEDLE HYPO 18GX1.5 BLUNT FILL (NEEDLE) IMPLANT
NEEDLE HYPO 21X1.5 SAFETY (NEEDLE) IMPLANT
NEEDLE HYPO 25X1 1.5 SAFETY (NEEDLE) IMPLANT
NEEDLE SPNL 20GX3.5 QUINCKE YW (NEEDLE) IMPLANT
NS IRRIG 1000ML POUR BTL (IV SOLUTION) ×3 IMPLANT
PACK LAMINECTOMY NEURO (CUSTOM PROCEDURE TRAY) ×3 IMPLANT
PAD ARMBOARD 7.5X6 YLW CONV (MISCELLANEOUS) ×9 IMPLANT
PATTIES SURGICAL .5 X1 (DISPOSABLE) ×3 IMPLANT
RUBBERBAND STERILE (MISCELLANEOUS) ×6 IMPLANT
SPONGE LAP 4X18 X RAY DECT (DISPOSABLE) IMPLANT
SPONGE SURGIFOAM ABS GEL SZ50 (HEMOSTASIS) ×3 IMPLANT
STRIP CLOSURE SKIN 1/2X4 (GAUZE/BANDAGES/DRESSINGS) ×2 IMPLANT
SUT VIC AB 0 CT1 18XCR BRD8 (SUTURE) ×1 IMPLANT
SUT VIC AB 0 CT1 8-18 (SUTURE) ×3
SUT VIC AB 2-0 CP2 18 (SUTURE) ×3 IMPLANT
SUT VIC AB 3-0 SH 8-18 (SUTURE) ×3 IMPLANT
SYR 20CC LL (SYRINGE) IMPLANT
SYR 20ML ECCENTRIC (SYRINGE) ×3 IMPLANT
SYR 5ML LL (SYRINGE) IMPLANT
TAPE STRIPS DRAPE STRL (GAUZE/BANDAGES/DRESSINGS) ×2 IMPLANT
TOWEL OR 17X24 6PK STRL BLUE (TOWEL DISPOSABLE) ×3 IMPLANT
TOWEL OR 17X26 10 PK STRL BLUE (TOWEL DISPOSABLE) ×3 IMPLANT
WATER STERILE IRR 1000ML POUR (IV SOLUTION) ×3 IMPLANT

## 2014-08-26 NOTE — Anesthesia Postprocedure Evaluation (Signed)
  Anesthesia Post-op Note  Patient: Wendy Le  Procedure(s) Performed: Procedure(s): Lumbar two-four- Decompressive Laminectomy (N/A)  Patient Location: PACU  Anesthesia Type:General  Level of Consciousness: awake  Airway and Oxygen Therapy: Patient Spontanous Breathing  Post-op Pain: moderate  Post-op Assessment: Post-op Vital signs reviewed, Patient's Cardiovascular Status Stable, Respiratory Function Stable, Patent Airway, No signs of Nausea or vomiting and Pain level controlled  Post-op Vital Signs: Reviewed and stable  Last Vitals:  Filed Vitals:   08/26/14 1830  BP: 129/55  Pulse: 74  Temp:   Resp: 18    Complications: No apparent anesthesia complications

## 2014-08-26 NOTE — Anesthesia Preprocedure Evaluation (Addendum)
Anesthesia Evaluation  Patient identified by MRN, date of birth, ID band Patient awake    Reviewed: Allergy & Precautions, H&P , NPO status , Patient's Chart, lab work & pertinent test results  Airway Mallampati: III   Neck ROM: Limited    Dental  (+) Edentulous Upper   Pulmonary former smoker,  breath sounds clear to auscultation        Cardiovascular Rhythm:Regular     Neuro/Psych Anxiety Depression    GI/Hepatic GERD-  Medicated and Controlled,  Endo/Other    Renal/GU      Musculoskeletal  (+) Arthritis -,   Abdominal (+)  Abdomen: soft.    Peds  Hematology   Anesthesia Other Findings Small mouth opening  Reproductive/Obstetrics                          Anesthesia Physical Anesthesia Plan  ASA: II  Anesthesia Plan: General   Post-op Pain Management:    Induction: Intravenous  Airway Management Planned: Oral ETT  Additional Equipment:   Intra-op Plan:   Post-operative Plan: Extubation in OR  Informed Consent: I have reviewed the patients History and Physical, chart, labs and discussed the procedure including the risks, benefits and alternatives for the proposed anesthesia with the patient or authorized representative who has indicated his/her understanding and acceptance.   Dental advisory given  Plan Discussed with: Anesthesiologist and Surgeon  Anesthesia Plan Comments:         Anesthesia Quick Evaluation

## 2014-08-26 NOTE — Transfer of Care (Signed)
Immediate Anesthesia Transfer of Care Note  Patient: Wendy Le  Procedure(s) Performed: Procedure(s): Lumbar two-four- Decompressive Laminectomy (N/A)  Patient Location: PACU  Anesthesia Type:General  Level of Consciousness: awake, alert  and oriented  Airway & Oxygen Therapy: Patient Spontanous Breathing  Post-op Assessment: Report given to PACU RN  Post vital signs: Reviewed and stable  Complications: No apparent anesthesia complications

## 2014-08-27 MED ORDER — INFLUENZA VAC SPLIT QUAD 0.5 ML IM SUSY
0.5000 mL | PREFILLED_SYRINGE | INTRAMUSCULAR | Status: AC
  Administered 2014-08-28: 0.5 mL via INTRAMUSCULAR
  Filled 2014-08-27: qty 0.5

## 2014-08-27 MED ORDER — PNEUMOCOCCAL VAC POLYVALENT 25 MCG/0.5ML IJ INJ
0.5000 mL | INJECTION | INTRAMUSCULAR | Status: DC
  Filled 2014-08-27: qty 0.5

## 2014-08-27 NOTE — Progress Notes (Signed)
Pt arrived to floor from PACU via stretcher. Pt able to ambulate to bed. Was told in report foley was removed and pt has already voided downstairs. A+Ox4. Moving all extremities. Honeycomb dressing marked with old drainage. Oriented patient to equipment and room. VSS. Will continue to monitor.

## 2014-08-27 NOTE — Anesthesia Postprocedure Evaluation (Signed)
Anesthesia Post Note  Patient: Wendy [name]  Procedure(s) Performed: Procedure(s) (LRB): Lumbar two-four- Decompressive Laminectomy (N/A)  Anesthesia type: general  Patient location: PACU  Post pain: Pain level controlled  Post assessment: Patient's Cardiovascular Status Stable  Last Vitals:  Filed Vitals:   08/27/14 0627  BP: 111/53  Pulse: 72  Temp: 36.9 C  Resp: 18    Post vital signs: Reviewed and stable  Level of consciousness: sedated  Complications: No apparent anesthesia complications

## 2014-08-27 NOTE — Op Note (Signed)
[name]:  [name], Wendy [name]                ACCOUNT NO.:  [id number]  MEDICAL RECORD NO.:  [id number]  LOCATION:  [id number]                        FACILITY:  [facility]  PHYSICIAN:  Hilda LiasErnesto Anuradha Chabot, M.D.   DATE OF BIRTH:  [birth year]  DATE OF PROCEDURE:  08/26/2014 DATE OF DISCHARGE:                              OPERATIVE REPORT   PREOPERATIVE DIAGNOSIS:  Lumbar stenosis L2-3, L3-4 with neurogenic claudication.  Status post fusion L5-S1.  POSTOPERATIVE DIAGNOSIS:  Lumbar stenosis L2-3, L3-4 with neurogenic claudication.  Status post fusion L5-S1.  PROCEDURE:  Bilateral L3 laminectomy, partial laminectomy of L2 and L4. Foraminotomy to decompress the L2, L3, and L4 nerve root.  Microscope.  SURGEON:  Hilda LiasErnesto Ranette Luckadoo, MD  CLINICAL HISTORY:  Wendy [name] is a 78 year old female who had lumbar fusion at L5-1 many years ago.  Lately, she had been complaining of back pain worsened to both legs.  The pain gets worse with walking.  X-rays showed severe stenosis at L2-3, moderate at L3-4.  The patient will proceed with surgery because she was not getting any better with conservative treatment.  She and her husband knew the risks and benefits of surgery.  PROCEDURE IN DETAIL:  The patient was taken to the OR.  After intubation, the skin was cleaned with Betadine and later on with DuraPrep.  Drapes were applied.  Midline incision from L2 down to L4 was made with retraction of the muscle all the way laterally.  X-rays showed that indeed the clips were at the level of L2-3.  From then on, we removed the spinous process of L3, spinous process of L2, and part of the spinous process of L4.  With the help of the drill, we drilled the bilateral lamina of L3, about 80% of the lamina of L2, and about 20% of the lamina of L4.  The patient had quite a bit of thick yellow ligament at the L2-3 and L3-4 _spaces_________.  With the help of a microscope, dissection was carried out.  Decompression was made all the way laterally.   We had plenty of space at the end for the thecal sac.  The foramina were opened with the 2 and 3 mm Kerrison punch.  At the end, there was plenty of space for the dural sac as well as the foramen.  The area was irrigated.  Vancomycin powder was left in the surgical site.  A Hemovac was left in the epidural space, and the wound was closed with Vicryl and Steri-Strips.          ______________________________ Hilda LiasErnesto Zaniyah Wernette, M.D.     EB/MEDQ  D:  08/26/2014  T:  08/27/2014  Job:  [id number]

## 2014-08-27 NOTE — Evaluation (Signed)
Occupational Therapy Evaluation Patient Details Name: Wendy [name] MRN: [id number] DOB: May 23, 1936 Today's Date: 08/27/2014    History of Present Illness 78 y.o. s/p Lumbar two-four- Decompressive Laminectomy.   Clinical Impression   Pt s/p above. Education provided during session and pt verbalized understanding.    Follow Up Recommendations  No OT follow up;Supervision - Intermittent    Equipment Recommendations  None recommended by OT    Recommendations for Other Services       Precautions / Restrictions Precautions Precautions: Back;Fall Precaution Booklet Issued: Yes (comment) Precaution Comments: educated on precautions Restrictions Weight Bearing Restrictions: No      Mobility Bed Mobility Overal bed mobility: Needs Assistance Bed Mobility: Rolling;Sidelying to Sit Rolling: Supervision Sidelying to sit: Mod assist       General bed mobility comments: assist with one LE and with trunk.   Transfers Overall transfer level: Needs assistance   Transfers: Sit to/from Stand Sit to Stand: Min guard         General transfer comment: cues for technique.    Balance                                            ADL Overall ADL's : Needs assistance/impaired     Grooming: Oral care;Brushing hair;Standing;Set up;Supervision/safety               Lower Body Dressing: Min guard;Sit to/from stand   Toilet Transfer: Min guard;Ambulation (chair/bed)   Toileting- Clothing Manipulation and Hygiene: Min guard;Sit to/from stand       Functional mobility during ADLs: Min guard;Rolling walker (ambulated with and without walker) General ADL Comments: Educated on use of cup for teeth care and placement of grooming items to avoid bending/twisting. Educated on  safety (safe shoewear, use of bag on walker, rugs, sitting for most of LB ADLs). Recommended someone being with her for shower transfer. Educated on AE for LB ADLs as pt appeared to be  twisting some when donning/doffing socks and reported pain when doing this at beginning of session . OT discussed recommending maintaining back precautions. Explained where she could get AE if she decides to get it.     Vision                     Perception     Praxis      Pertinent Vitals/Pain Pain Assessment: 0-10 Pain Score: 2  Pain Location: back Pain Intervention(s): Monitored during session     Hand Dominance Right   Extremity/Trunk Assessment Upper Extremity Assessment Upper Extremity Assessment: Overall WFL for tasks assessed   Lower Extremity Assessment Lower Extremity Assessment: Defer to PT evaluation       Communication Communication Communication: No difficulties   Cognition Arousal/Alertness: Awake/alert Behavior During Therapy: WFL for tasks assessed/performed Overall Cognitive Status: Within Functional Limits for tasks assessed                     General Comments       Exercises       Shoulder Instructions      Home Living Family/patient expects to be discharged to:: Private residence Living Arrangements: Spouse/significant other Available Help at Discharge: Family;Other (Comment) (rarely has to go out) Type of Home: House Home Access: Stairs to enter (also one step inside-no rail)     Home Layout: One level  Bathroom Shower/Tub: Producer, television/film/videoWalk-in shower   Bathroom Toilet: Standard     Home Equipment: Bedside commode;Shower seat - built in          Prior Functioning/Environment Level of Independence: Independent             OT Diagnosis: Acute pain   OT Problem List:     OT Treatment/Interventions:      OT Goals(Current goals can be found in the care plan section)    OT Frequency:     Barriers to D/C:            Co-evaluation              End of Session Equipment Utilized During Treatment: Gait belt;Rolling walker  Activity Tolerance: Patient tolerated treatment well Patient left: in chair;with  call bell/phone within reach;with chair alarm set   Time: 1203-1229 OT Time Calculation (min): 26 min Charges:  OT General Charges $OT Visit: 1 Procedure OT Evaluation $Initial OT Evaluation Tier I: 1 Procedure OT Treatments $Self Care/Home Management : 8-22 mins G-CodesEarlie [name]:    Wendy [name] [phone] 08/27/2014, 12:57 PM

## 2014-08-27 NOTE — Progress Notes (Signed)
Pt dressing was intact with bloody drainage. Was reinforced with pressure dressing.

## 2014-08-27 NOTE — Evaluation (Signed)
Physical Therapy Evaluation Patient Details Name: Wendy [name] MRN: [id number] DOB: 06/18/1936 Today's Date: 08/27/2014   History of Present Illness  78 y.o. s/p Lumbar two-four- Decompressive Laminectomy.  Clinical Impression  Patient presents with problems listed below.  Will benefit from acute PT to maximize independence prior to discharge home with husband.  Patient required assist for bed mobility and reminders to maintain back precautions.  Assist for gait for safety - unsteady gait.  Recommend f/u HHPT for continued therapy at discharge.    Follow Up Recommendations Home health PT;Supervision/Assistance - 24 hour    Equipment Recommendations  Rolling walker with 5" wheels    Recommendations for Other Services       Precautions / Restrictions Precautions Precautions: Back;Fall Precaution Comments: Reviewed back precautions with patient.  Able to recall 2/3 from OT session. Restrictions Weight Bearing Restrictions: No      Mobility  Bed Mobility Overal bed mobility: Needs Assistance Bed Mobility: Rolling;Sidelying to Sit;Sit to Sidelying Rolling: Supervision Sidelying to sit: Mod assist     Sit to sidelying: Min assist General bed mobility comments: Verbal cues for technique. Patient using rail to roll to side.  Assist to raise trunk to sitting position.  Once upright, patient with good balance.  In sitting, patient began to reach behind her to get phone.  Stopped patient and educated on not twisting.  Provided phone to patient.  Required min assist to bring LE's onto bed to return to sidelying.  Transfers Overall transfer level: Needs assistance Equipment used: Rolling walker (2 wheeled) Transfers: Sit to/from Stand Sit to Stand: Min guard         General transfer comment: Verbal cues for hand placement and technique.  Min guard for safety/balance.  Ambulation/Gait Ambulation/Gait assistance: Min guard Ambulation Distance (Feet): 82 Feet Assistive device:  Rolling walker (2 wheeled) Gait Pattern/deviations: Step-through pattern;Decreased stride length;Antalgic;Narrow base of support Gait velocity: Decreased Gait velocity interpretation: Below normal speed for age/gender General Gait Details: Verbal cues for safe use of RW.  Patient with upright posture during gait.  Slow guarded gait speed due to pain.  Stairs            Wheelchair Mobility    Modified Rankin (Stroke Patients Only)       Balance                                             Pertinent Vitals/Pain Pain Assessment: 0-10 Pain Score: 8  Pain Location: Back Pain Descriptors / Indicators: Aching;Sore Pain Intervention(s): Limited activity within patient's tolerance;Patient requesting pain meds-RN notified    Home Living Family/patient expects to be discharged to:: Private residence Living Arrangements: Spouse/significant other Available Help at Discharge: Family;Available 24 hours/day Type of Home: House Home Access: Stairs to enter Entrance Stairs-Rails: Doctor, general practiceight;Left Entrance Stairs-Number of Steps: 3 Home Layout: One level Home Equipment: Bedside commode;Shower seat - built in      Prior Function Level of Independence: Independent               Hand Dominance   Dominant Hand: Right    Extremity/Trunk Assessment   Upper Extremity Assessment: Overall WFL for tasks assessed           Lower Extremity Assessment: Overall WFL for tasks assessed         Communication   Communication: No difficulties  Cognition Arousal/Alertness: Awake/alert Behavior  During Therapy: WFL for tasks assessed/performed Overall Cognitive Status: Within Functional Limits for tasks assessed                      General Comments      Exercises        Assessment/Plan    PT Assessment Patient needs continued PT services  PT Diagnosis Difficulty walking;Abnormality of gait;Acute pain   PT Problem List Decreased strength;Decreased  activity tolerance;Decreased balance;Decreased mobility;Decreased knowledge of use of DME;Decreased knowledge of precautions;Pain  PT Treatment Interventions DME instruction;Gait training;Stair training;Functional mobility training;Therapeutic activities;Patient/family education   PT Goals (Current goals can be found in the Care Plan section) Acute Rehab PT Goals Patient Stated Goal: To go home PT Goal Formulation: With patient Time For Goal Achievement: 09/03/14 Potential to Achieve Goals: Good    Frequency Min 5X/week   Barriers to discharge        Co-evaluation               End of Session Equipment Utilized During Treatment: Gait belt Activity Tolerance: Patient tolerated treatment well;Patient limited by pain Patient left: in bed;with call bell/phone within reach;with bed alarm set;with nursing/sitter in room Nurse Communication: Mobility status;Patient requests pain meds         Time: [id number] PT Time Calculation (min): 29 min   Charges:   PT Evaluation $Initial PT Evaluation Tier I: 1 Procedure PT Treatments $Gait Training: 8-22 mins $Therapeutic Activity: 8-22 mins   PT G Codes:          Vena AustriaDavis, Geneen Dieter H 08/27/2014, 5:27 PM Durenda HurtSusan H. Renaldo Fiddleravis, PT, [facility] CenterMBA Acute Rehab Services Pager [phone]

## 2014-08-27 NOTE — Progress Notes (Signed)
Subjective: Patient reports "a bad night with back pain." Denies leg pain or numbness tingling or weakness. She states she is better this morning  Objective: Vital signs in last 24 hours: Temp:  [97.5 F (36.4 C)-98.9 F (37.2 C)] 98.4 F (36.9 C) (11/07 0627) Pulse Rate:  [70-93] 72 (11/07 0627) Resp:  [14-23] 18 (11/07 0627) BP: (109-143)/(45-81) 111/53 mmHg (11/07 0627) SpO2:  [94 %-100 %] 97 % (11/07 0627) Weight:  [146 lb (66.225 kg)-191 lb 8 oz (86.864 kg)] 191 lb 8 oz (86.864 kg) (11/07 0141)  Intake/Output from previous day: 11/06 0730 - 11/07 0729 In: 1000 [I.V.:1000] Out: 101 [Urine:1; Blood:100] Intake/Output this shift:    Neurologic: Grossly normal  Lab Results: Lab Results  Component Value Date   WBC 8.5 08/18/2014   HGB 12.2 08/18/2014   HCT 35.7* 08/18/2014   MCV 91.5 08/18/2014   PLT 346 08/18/2014   No results found for: INR, PROTIME BMET Lab Results  Component Value Date   NA 141 08/18/2014   K 3.9 08/18/2014   CL 103 08/18/2014   CO2 26 08/18/2014   GLUCOSE 85 08/18/2014   BUN 17 08/18/2014   CREATININE 0.81 08/18/2014   CALCIUM 9.5 08/18/2014    Studies/Results: Dg Lumbar Spine 2-3 Views  08/26/2014   CLINICAL DATA:  Back surgery.  EXAM: LUMBAR SPINE - 2-3 VIEW  COMPARISON:  None.  FINDINGS: On image number 2 metal probes noted along the posterior aspect of the L2 vertebral body. Metal probes are also noted along the posterior aspect of the L4-L5 disc space. Patient has had prior posterior interbody fusion L5-S1.  IMPRESSION: Metal probe positions as described above.   Electronically Signed   By: Maisie Fushomas  Register   On: 08/26/2014 17:28    Assessment/Plan: Seems to be doing okay. She feels uncomfortable going home today as she feels like she cannot mobilize herself. She is worried about her husband helping her today "because he is also sick." Hopefully home tomorrow.   LOS: 1 day    Wendy [name] S 08/27/2014, 9:04 AM

## 2014-08-28 DIAGNOSIS — M4806 Spinal stenosis, lumbar region: Secondary | ICD-10-CM | POA: Diagnosis not present

## 2014-08-28 MED ORDER — POLYETHYLENE GLYCOL 3350 17 G PO PACK
17.0000 g | PACK | Freq: Every day | ORAL | Status: DC
  Administered 2014-08-28 – 2014-08-29 (×2): 17 g via ORAL
  Filled 2014-08-28 (×2): qty 1

## 2014-08-28 MED ORDER — SENNA 8.6 MG PO TABS
1.0000 | ORAL_TABLET | Freq: Two times a day (BID) | ORAL | Status: DC
  Administered 2014-08-28 – 2014-08-29 (×2): 8.6 mg via ORAL
  Filled 2014-08-28 (×2): qty 1

## 2014-08-28 NOTE — Progress Notes (Signed)
Physical Therapy Treatment Patient Details Name: Wendy Le MRN: [id number] DOB: 06-18-1936 Today's Date: 08/28/2014    History of Present Illness 78 y.o. s/p Lumbar two-four- Decompressive Laminectomy.    PT Comments    Pt moving fairly well.  In bathroom with NT upon arrival.  Currently at min guard level for transfers & gait with cues for hand placement, & pt safe with ambulation but slow & guarded.   Pt states she wishes to wait to d/c home tomorrow due her husband is still not feeling well & wants to make sure he will be able to assist her has needed.  Pt needs to practice steps before d/cing home.      Follow Up Recommendations  Home health PT;Supervision/Assistance - 24 hour     Equipment Recommendations  Rolling walker with 5" wheels    Recommendations for Other Services       Precautions / Restrictions Precautions Precautions: Back;Fall Precaution Comments: Reviewed back precautions with patient.  Able to recall 2/3 from OT session. Restrictions Weight Bearing Restrictions: No    Mobility  Bed Mobility                  Transfers Overall transfer level: Needs assistance Equipment used: Rolling walker (2 wheeled) Transfers: Sit to/from Stand Sit to Stand: Min guard         General transfer comment: Verbal cues for hand placement and technique.  Min guard for safety/balance.  Ambulation/Gait Ambulation/Gait assistance: Min guard Ambulation Distance (Feet): 250 Feet Assistive device: Rolling walker (2 wheeled) Gait Pattern/deviations: Step-through pattern;Decreased stride length Gait velocity: Decreased   General Gait Details: slow guarded gait due to pain but overall safe.     Stairs            Wheelchair Mobility    Modified Rankin (Stroke Patients Only)       Balance                                    Cognition Arousal/Alertness: Awake/alert Behavior During Therapy: WFL for tasks assessed/performed Overall  Cognitive Status: Within Functional Limits for tasks assessed                      Exercises      General Comments        Pertinent Vitals/Pain Pain Assessment: 0-10 Pain Score: 7  Pain Location: back Pain Descriptors / Indicators: Aching Pain Intervention(s): Monitored during session;Repositioned (Notified RN)    Home Living                      Prior Function            PT Goals (current goals can now be found in the care plan section) Acute Rehab PT Goals Patient Stated Goal: To go home PT Goal Formulation: With patient Time For Goal Achievement: 09/03/14 Potential to Achieve Goals: Good Progress towards PT goals: Progressing toward goals    Frequency  Min 5X/week    PT Plan Current plan remains appropriate    Co-evaluation             End of Session Equipment Utilized During Treatment: Gait belt Activity Tolerance: Patient tolerated treatment well;Patient limited by pain Patient left: in chair;with call bell/phone within reach     Time: 0835-0855 PT Time Calculation (min): 20 min  Charges:  $Gait Training: 8-22 mins  G Codes:      Wendy Le, Wendy Le 08/28/2014, 9:04 AM   Wendy Le, PTA [phone] 08/28/2014

## 2014-08-28 NOTE — Progress Notes (Signed)
Postop day 2. Patient having increased pain today. Pain itself is a largely confined to the incisional area. She still has some pain radiating to her right lower extremity. No new complaints of numbness or weakness.  Afebrile. Vitals are stable. Wound dressing clean and dry. Motor and sensory function stable. Abdomen soft.  Progressing slowly following lumbar decompression. Continue efforts at mobilization. Likely discharge home tomorrow.

## 2014-08-29 NOTE — Progress Notes (Signed)
D/c orders received. Pt educated on d/c instructions and spinal precautions. Pt verbalized understanding. Pt handed d/c packet. Brace delivered to room. Pt states that she has a rolling walker at home. IV removed. Pt taken downstairs by volunteer via wheelchair.

## 2014-08-29 NOTE — Progress Notes (Signed)
Physical Therapy Treatment Patient Details Name: Wendy Le MRN: [id number] DOB: [birth year] Today's Date: 08/29/2014    History of Present Illness 78 y.o. s/p Lumbar two-four- Decompressive Laminectomy.    PT Comments    Patient progressing well with mobility and able to complete stair training this session. Patient with occasional spasms during mobility. Patient safe to D/C from a mobility standpoint based on progression towards goals set on PT eval.    Follow Up Recommendations  Home health PT;Supervision/Assistance - 24 hour     Equipment Recommendations  Rolling walker with 5" wheels    Recommendations for Other Services       Precautions / Restrictions Precautions Precautions: Back;Fall Precaution Comments: Reviewed back precautions with patient.  Able to recall 2/3. Cues for no arching Restrictions Weight Bearing Restrictions: No    Mobility  Bed Mobility Overal bed mobility: Needs Assistance           Sit to sidelying: Min assist General bed mobility comments: Min A for LE back into bed and for positioning once in the bed. Patient tends to lean on back instead on sidelying. Cues for log roll technique  Transfers Overall transfer level: Needs assistance Equipment used: Rolling walker (2 wheeled)   Sit to Stand: Supervision         General transfer comment: Patient with correct technique this session  Ambulation/Gait Ambulation/Gait assistance: Min guard Ambulation Distance (Feet): 400 Feet Assistive device: Rolling walker (2 wheeled) Gait Pattern/deviations: Step-through pattern;Decreased stride length Gait velocity: Decreased Gait velocity interpretation: Below normal speed for age/gender General Gait Details: slow guarded gait due to pain but overall safe.     Stairs Stairs: Yes Stairs assistance: Min guard Stair Management: Forwards;One rail Right;Step to pattern;Alternating pattern Number of Stairs: 5 General stair comments: Cues for  step to pattern vs. alternating due to pain  Wheelchair Mobility    Modified Rankin (Stroke Patients Only)       Balance                                    Cognition Arousal/Alertness: Awake/alert Behavior During Therapy: WFL for tasks assessed/performed Overall Cognitive Status: Within Functional Limits for tasks assessed                      Exercises      General Comments        Pertinent Vitals/Pain Pain Score: 7  Pain Location: back Pain Descriptors / Indicators: Spasm Pain Intervention(s): Monitored during session;Patient requesting pain meds-RN notified    Home Living                      Prior Function            PT Goals (current goals can now be found in the care plan section) Progress towards PT goals: Progressing toward goals    Frequency  Min 5X/week    PT Plan Current plan remains appropriate    Co-evaluation             End of Session Equipment Utilized During Treatment: Gait belt Activity Tolerance: Patient tolerated treatment well Patient left: with call bell/phone within reach;in bed     Time: [id number] PT Time Calculation (min): 24 min  Charges:  $Gait Training: 8-22 mins $Therapeutic Activity: 8-22 mins  G Codes:      Fredrich BirksRobinette, Agustin Swatek Elizabeth 08/29/2014, 9:10 AM  08/29/2014 Fredrich Birksobinette, Tameah Mihalko Elizabeth PTA [phone] pager [phone] office

## 2014-08-29 NOTE — Discharge Summary (Signed)
Physician Discharge Summary  Patient ID: Leonette Monarchrmgard W Lafon MRN: [id number] DOB/AGE: [birth year] 78 y.o.  Admit date: 08/26/2014 Discharge date: 08/29/2014  Admission Diagnoses:lumbar stenosis  Discharge Diagnoses:  Active Problems:   Lumbar stenosis with neurogenic claudication   Discharged Condition: no weakness  Hospital Course: surgery  Consults: none  Significant Diagnostic Studies:myelogram  Treatments: lumbar decompression  Discharge Exam: Blood pressure 122/63, pulse 72, temperature 97.2 F (36.2 C), temperature source Oral, resp. rate 18, height 5\' 2"  (1.575 m), weight 86.864 kg (191 lb 8 oz), SpO2 98 %. No weakness  Disposition: home     Medication List    ASK your doctor about these medications        ALPRAZolam 0.5 MG tablet  Commonly known as:  XANAX  Take 0.5 mg by mouth at bedtime as needed for anxiety.     citalopram 40 MG tablet  Commonly known as:  CELEXA  Take 40 mg by mouth daily.     cyclobenzaprine 10 MG tablet  Commonly known as:  FLEXERIL  Take 10 mg by mouth 3 (three) times daily as needed for muscle spasms.     DEXILANT 60 MG capsule  Generic drug:  dexlansoprazole  Take 60 mg by mouth daily.     HYDROcodone-acetaminophen 5-325 MG per tablet  Commonly known as:  NORCO/VICODIN  Take 1 tablet by mouth every 6 (six) hours as needed for moderate pain.     lubiprostone 24 MCG capsule  Commonly known as:  AMITIZA  Take 24 mcg by mouth daily.     meloxicam 15 MG tablet  Commonly known as:  MOBIC  Take 15 mg by mouth daily.     Oxycodone HCl 10 MG Tabs  Take 10 mg by mouth every 6 (six) hours as needed (for pain).     pantoprazole 40 MG tablet  Commonly known as:  PROTONIX  Take 40 mg by mouth daily.     SUMAtriptan 100 MG tablet  Commonly known as:  IMITREX  Take 100 mg by mouth every 2 (two) hours as needed for migraine or headache.     traMADol 50 MG tablet  Commonly known as:  ULTRAM  Take 50 mg by mouth every 6 (six) hours  as needed for moderate pain.     zolpidem 10 MG tablet  Commonly known as:  AMBIEN  Take 10 mg by mouth at bedtime as needed for sleep.         Signed: Karn CassisBOTERO,Tahja Liao M 08/29/2014, 9:23 AM

## 2014-08-29 NOTE — Progress Notes (Signed)
Orthopedic Tech Progress Note Patient Details:  Wendy Le 09-Oct-1936 [id number]  Patient ID: Wendy Le, female   DOB: 09-Oct-1936, 78 y.o.   MRN: [id number] Brace order completed by Storm Friskbio-tech  Wendy Le 08/29/2014, 11:01 AM

## 2014-08-29 NOTE — Progress Notes (Signed)
Per Scarlette CalicoFrances RN, no HHC needed or DME needed; Alexis GoodellB Shaleta Ruacho RN,BSN,MHA [phone]

## 2014-08-30 ENCOUNTER — Encounter (HOSPITAL_COMMUNITY): Payer: Self-pay | Admitting: Neurosurgery

## 2015-04-13 IMAGING — CT CT L SPINE W/ CM
4 of 11 series · 13 of 33 positions shown, 14 images · non-contrast
Comparison: None

CLINICAL DATA: Spondylosis without myelopathy. Previous lower
lumbar fusion. Back pain and bilateral leg pain.
TECHNIQUE: Contiguous axial images were obtained through the Lumbar spine after
the intrathecal infusion of infusion. Coronal and sagittal
reconstructions were obtained of the axial image sets.

[Series 2: l spine bone · axial · 0.27mm/px · z∈[-199,-124]mm · 2 of 91 slices shown, 3 images]
[im 31/91  soft-tissue]
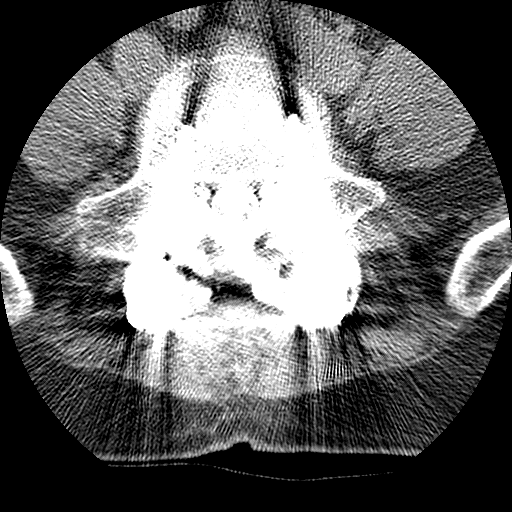
[im 31/91  bone]
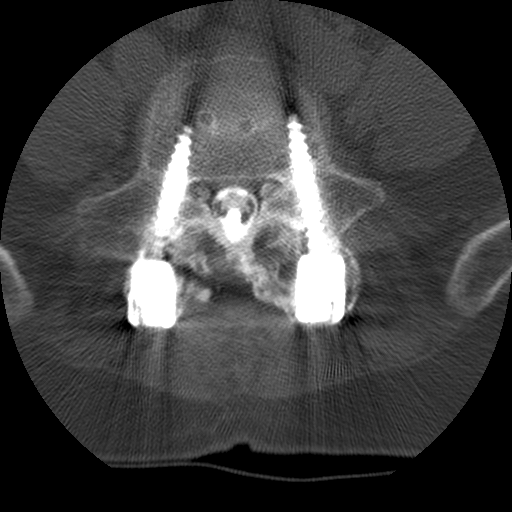
[im 61/91  bone]
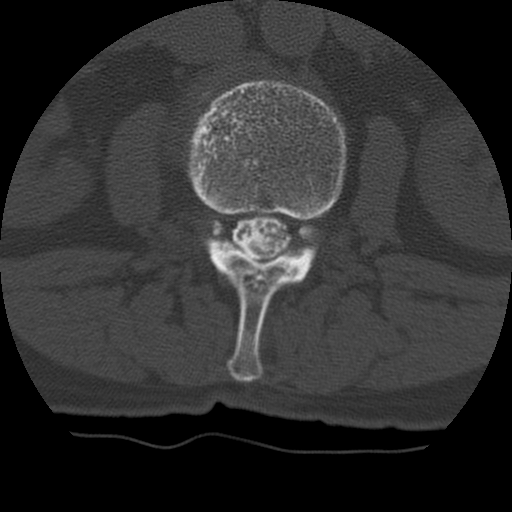

[Series 3: l spine soft · axial · 0.27mm/px · z∈[-219,-107]mm · 3 of 91 slices shown]
[im 23/91  soft-tissue]
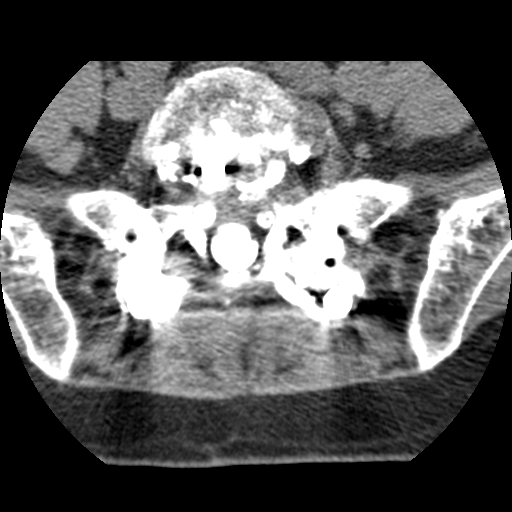
[im 46/91  soft-tissue]
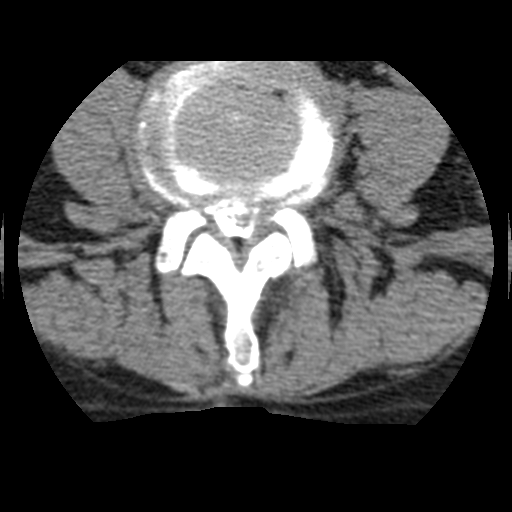
[im 68/91  soft-tissue]
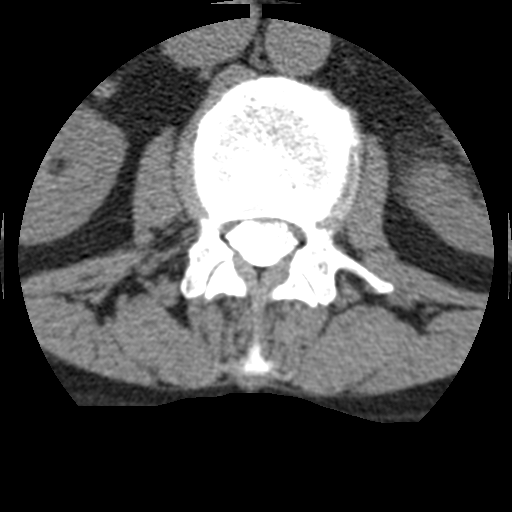

[Series 103: cor · coronal · 0.45mm/px · 3 of 50 slices shown]
[im 13/50  bone]
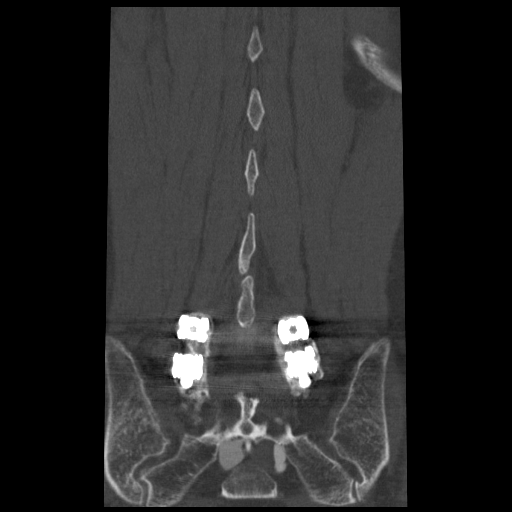
[im 25/50  bone]
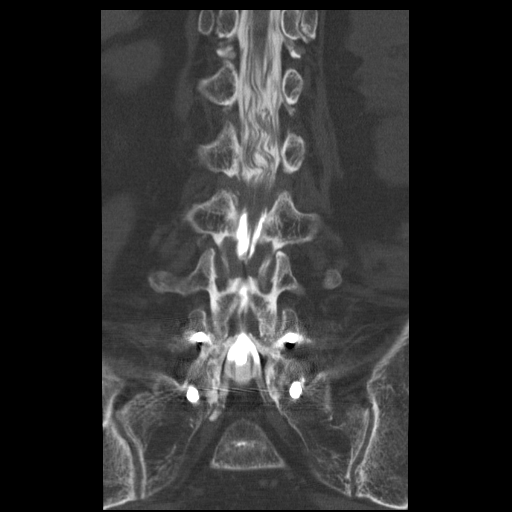
[im 37/50  bone]
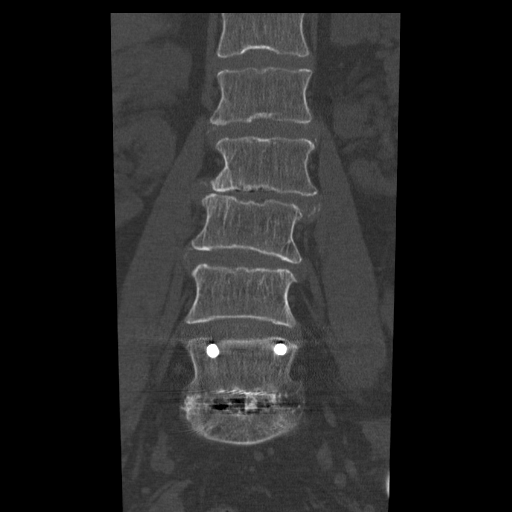

[Series 105: sag · sagittal · 0.27mm/px · 5 of 54 slices shown]
[im 9/54  bone]
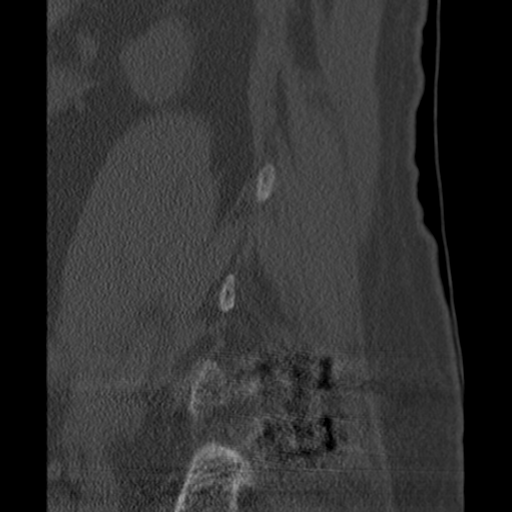
[im 18/54  bone]
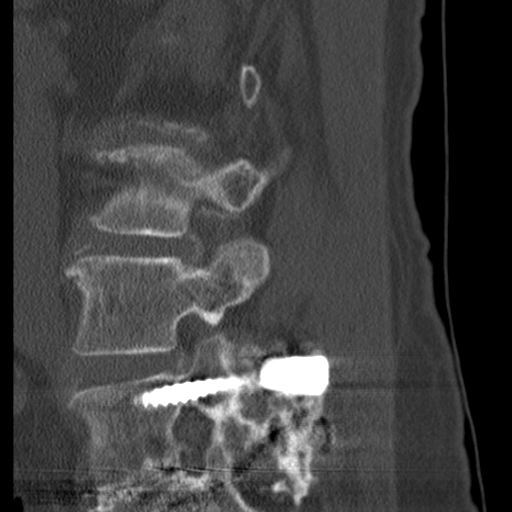
[im 27/54  bone]
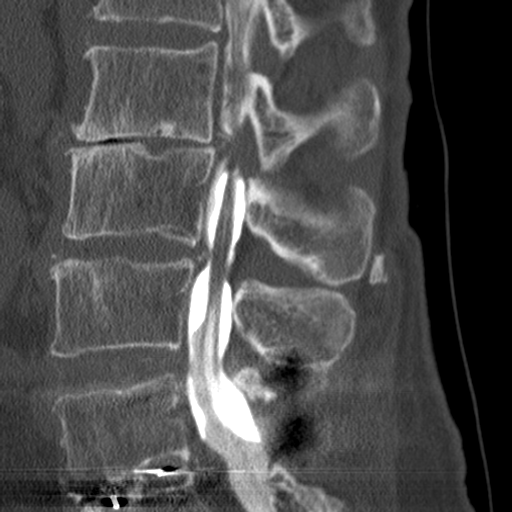
[im 36/54  bone]
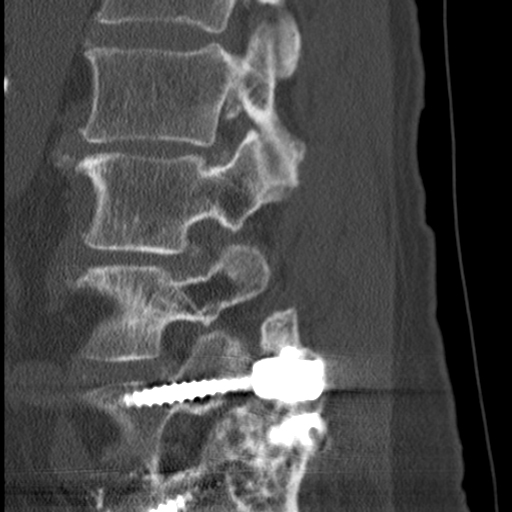
[im 45/54  bone]
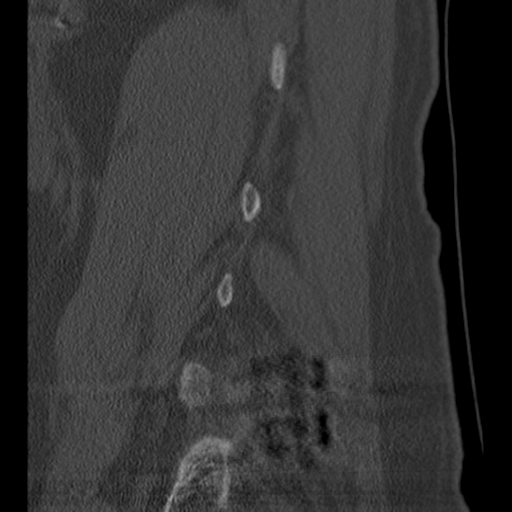

[13 of 33 positions shown; findings below may reference images not displayed]

EXAM:
LUMBAR MYELOGRAM and post myelogram CT

FLUOROSCOPY TIME:  1 min 8 seconds

PROCEDURE:
After thorough discussion of risks and benefits of the procedure
including bleeding, infection, injury to nerves, blood vessels,
adjacent structures as well as headache and CSF leak, written and
oral informed consent was obtained. Consent was obtained by Dr. Chava
Jumper. Time out form was completed.

Patient was positioned prone on the fluoroscopy table. Local
anesthesia was provided with 1% lidocaine without epinephrine after
prepped and draped in the usual sterile fashion. Puncture was
performed at L2-3 using a 3 1/2 inch 22-gauge spinal needle via left
approach. Using a single pass through the dura, the needle was
placed within the thecal sac, with return of clear CSF. 15 mL of
[id number] was injected into the thecal sac, with normal
opacification of the nerve roots and cauda equina consistent with
free flow within the subarachnoid space.

I personally performed the lumbar puncture and administered the
intrathecal contrast. I also personally performed acquisition of the
myelogram images.
FINDINGS: LUMBAR MYELOGRAM FINDINGS:

The injection is mixed, with a prominent subdural component at L3
and below. I could not appreciate this at the time of injection.

L1-2: Small anterior extradural defect.  No compressive stenosis.

L2-3: Left were translation of L2 relative to L3 of 1 cm. Severe
multifactorial spinal stenosis.

L3-4: Anterior extradural defect. Stenosis of both lateral recesses
that could cause neural compression.

L4-5: Mild stenosis, primarily because of posterior element
encroachment.

L5-S1:  No apparent compressive stenosis.

Standing lateral flexion extension views do not show any abnormal
motion. No motion occurs at the fusion segment.

CT LUMBAR MYELOGRAM FINDINGS:

The injection is mixed, with a prominent subdural component at L3
and below.

T12-L1: Unremarkable interspace. The distal cord and conus are
normal with the conus tip at mid L1.

L1-2: Circumferential bulging of the disc. No compressive stenosis.

L2-3: Severe multifactorial spinal stenosis. Disc degeneration with
loss of disc height. Circumferential herniation of disc material
with calcification. Bilateral facet and ligamentous hypertrophy.
Effacement of the subarachnoid space and compression of the nerve
roots. Neural foraminal stenosis bilaterally, right worse than left.

L3-4: Extensive subdural contrast. Stenosis of both lateral
recesses, left more than right, because of circumferential
protrusion of disc material with calcification in combination with
facet and ligamentous hypertrophy. Neural compression could occur at
this level, particularly on the left. There is foraminal narrowing
left more than right.

L4-5: The disc bulges mildly in a diffuse fashion. There is facet
and ligamentous hypertrophy. There is mild stenosis of both lateral
recesses without definite neural compression.

L5-S1: Previous decompression, diskectomy and fusion. Fusion is
solid. Wide patency of the canal and foramina.
IMPRESSION: The injection is mixed, with a prominent subdural component at L3
and below. Nonetheless, the study is sufficiently diagnostic.

L1-2:  Disc bulge without compressive stenosis.

L2-3: Severe multifactorial spinal stenosis. Disc degeneration with
loss of disc height and circumferential herniation of disc material.
Facet and ligamentous hypertrophy. Obliteration of the subarachnoid
space and compression of the nerve roots diffusely. Neural foraminal
stenosis bilaterally.

L3-4: Circumferential protrusion of disc material. Facet and
ligamentous hypertrophy. Stenosis of the lateral recesses that could
cause neural compression on either side. Foraminal narrowing worse
on the left.

L4-5: Mild disc bulge. Facet and ligamentous hypertrophy. Mild
stenosis of both lateral recesses but without likely neural
compression.

L5-S1:  Previous discectomy and fusion.  Solid fusion.  No stenosis.

## 2015-05-27 IMAGING — CR DG LUMBAR SPINE 2-3V
1 series · 1 of 1 positions shown · non-contrast
Comparison: None.

CLINICAL DATA: Back surgery.

EXAM:
LUMBAR SPINE - 2-3 VIEW

[lat]
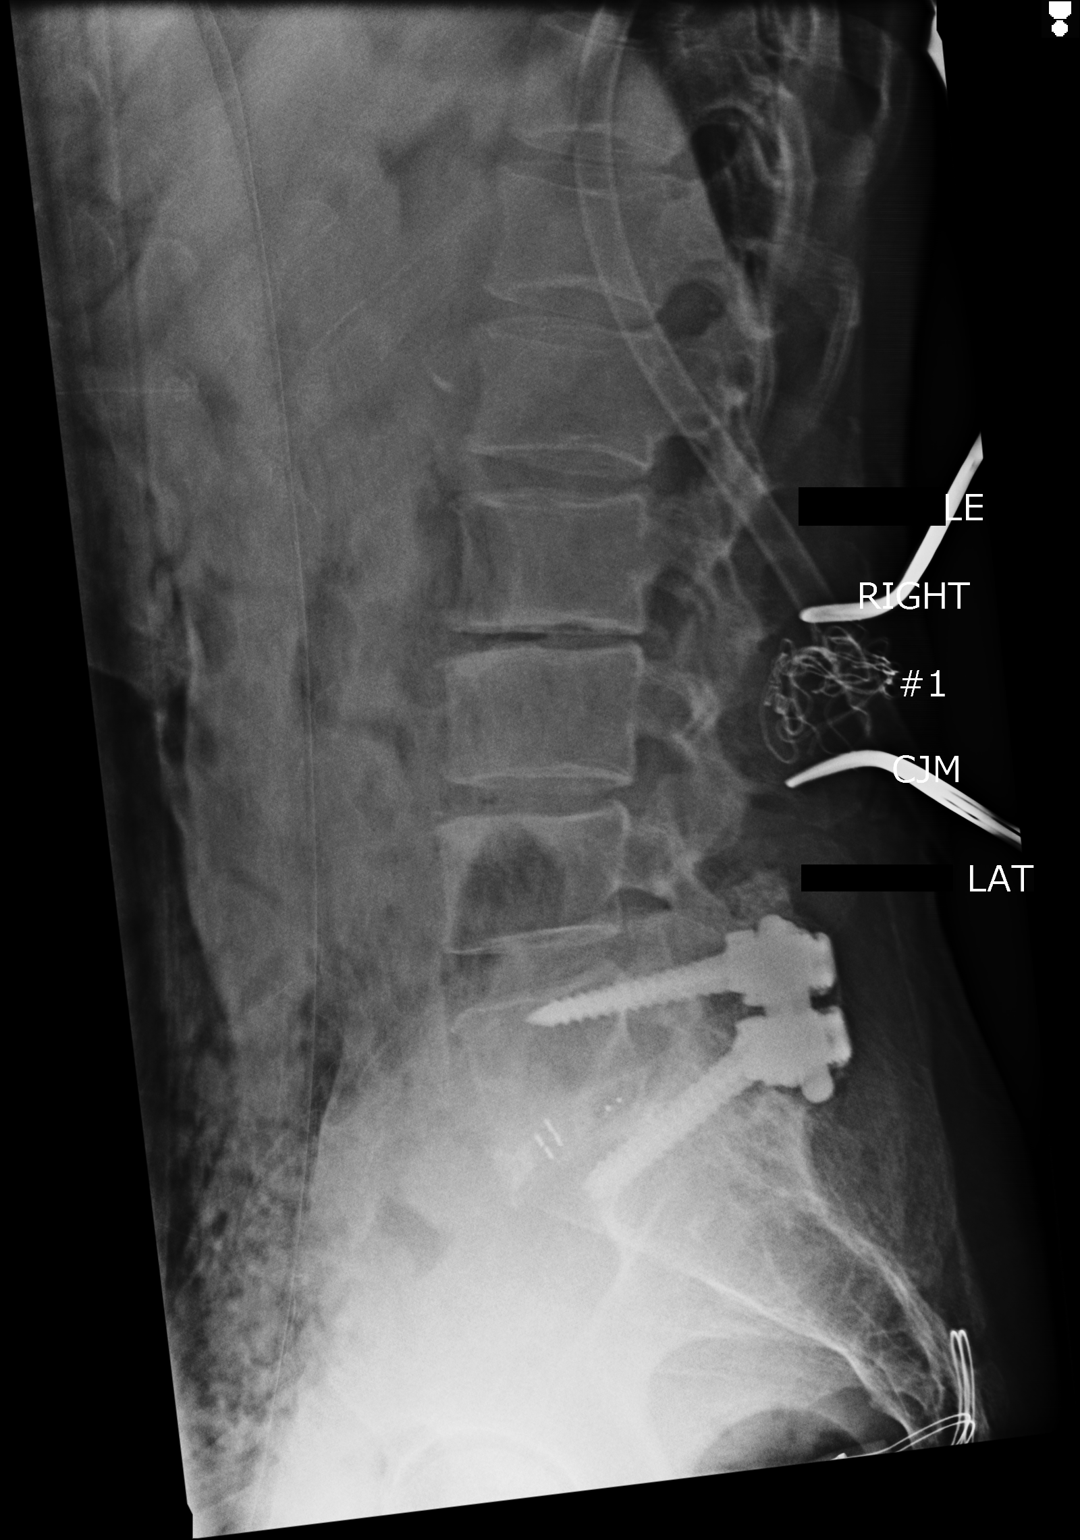

[1 of 1 positions shown; findings below may reference images not displayed]

FINDINGS: On image number 2 metal probes noted along the posterior aspect of
the L2 vertebral body. Metal probes are also noted along the
posterior aspect of the L4-L5 disc space. Patient has had prior
posterior interbody fusion L5-S1.
IMPRESSION: Metal probe positions as described above.

## 2018-03-06 ENCOUNTER — Encounter: Payer: Self-pay | Admitting: Gastroenterology
